# Patient Record
Sex: Female | Born: 1960 | Race: White | Hispanic: No | Marital: Married | Smoking: Current every day smoker
Health system: Southern US, Community
[De-identification: ages and names within clinical notes are randomized; demographics above are authoritative.]

## PROBLEM LIST (undated history)

## (undated) DIAGNOSIS — G8929 Other chronic pain: Secondary | ICD-10-CM

## (undated) DIAGNOSIS — J449 Chronic obstructive pulmonary disease, unspecified: Secondary | ICD-10-CM

## (undated) DIAGNOSIS — R188 Other ascites: Secondary | ICD-10-CM

## (undated) DIAGNOSIS — K7460 Unspecified cirrhosis of liver: Secondary | ICD-10-CM

## (undated) DIAGNOSIS — R7303 Prediabetes: Secondary | ICD-10-CM

## (undated) DIAGNOSIS — I10 Essential (primary) hypertension: Secondary | ICD-10-CM

## (undated) DIAGNOSIS — M549 Dorsalgia, unspecified: Secondary | ICD-10-CM

## (undated) DIAGNOSIS — F32A Depression, unspecified: Secondary | ICD-10-CM

## (undated) DIAGNOSIS — F329 Major depressive disorder, single episode, unspecified: Secondary | ICD-10-CM

## (undated) DIAGNOSIS — J45909 Unspecified asthma, uncomplicated: Secondary | ICD-10-CM

## (undated) DIAGNOSIS — K219 Gastro-esophageal reflux disease without esophagitis: Secondary | ICD-10-CM

## (undated) DIAGNOSIS — E669 Obesity, unspecified: Secondary | ICD-10-CM

## (undated) HISTORY — PX: ROTATOR CUFF REPAIR: SHX139

## (undated) HISTORY — PX: OTHER SURGICAL HISTORY: SHX169

---

## 1984-02-15 HISTORY — PX: KNEE ARTHROSCOPY W/ INTERNAL FIXATION TIBIAL SPINE FRACTURE: SHX1871

## 1993-02-14 HISTORY — PX: LUMBAR DISC SURGERY: SHX700

## 1997-08-05 ENCOUNTER — Encounter: Admission: RE | Admit: 1997-08-05 | Discharge: 1997-11-03 | Payer: Self-pay | Admitting: Anesthesiology

## 1997-09-03 ENCOUNTER — Encounter: Admission: RE | Admit: 1997-09-03 | Discharge: 1997-12-02 | Payer: Self-pay | Admitting: Anesthesiology

## 1998-02-14 HISTORY — PX: LUMBAR FUSION: SHX111

## 1998-04-22 ENCOUNTER — Ambulatory Visit (HOSPITAL_COMMUNITY): Admission: RE | Admit: 1998-04-22 | Discharge: 1998-04-22 | Payer: Self-pay | Admitting: Neurosurgery

## 1998-04-22 ENCOUNTER — Encounter: Payer: Self-pay | Admitting: Neurosurgery

## 1998-05-18 ENCOUNTER — Ambulatory Visit: Admission: RE | Admit: 1998-05-18 | Discharge: 1998-05-18 | Payer: Self-pay | Admitting: Neurosurgery

## 1998-05-18 ENCOUNTER — Encounter: Payer: Self-pay | Admitting: Neurosurgery

## 1998-12-07 ENCOUNTER — Encounter: Payer: Self-pay | Admitting: Neurosurgery

## 1998-12-10 ENCOUNTER — Inpatient Hospital Stay (HOSPITAL_COMMUNITY): Admission: RE | Admit: 1998-12-10 | Discharge: 1998-12-12 | Payer: Self-pay | Admitting: Neurosurgery

## 1998-12-10 ENCOUNTER — Encounter: Payer: Self-pay | Admitting: Neurosurgery

## 1999-01-11 ENCOUNTER — Ambulatory Visit (HOSPITAL_COMMUNITY): Admission: RE | Admit: 1999-01-11 | Discharge: 1999-01-11 | Payer: Self-pay | Admitting: Neurosurgery

## 1999-01-11 ENCOUNTER — Encounter: Payer: Self-pay | Admitting: Neurosurgery

## 1999-03-22 ENCOUNTER — Encounter: Payer: Self-pay | Admitting: Neurosurgery

## 1999-03-22 ENCOUNTER — Ambulatory Visit (HOSPITAL_COMMUNITY): Admission: RE | Admit: 1999-03-22 | Discharge: 1999-03-22 | Payer: Self-pay | Admitting: Neurosurgery

## 1999-09-27 ENCOUNTER — Encounter: Payer: Self-pay | Admitting: Physical Medicine and Rehabilitation

## 1999-09-27 ENCOUNTER — Ambulatory Visit (HOSPITAL_COMMUNITY)
Admission: RE | Admit: 1999-09-27 | Discharge: 1999-09-27 | Payer: Self-pay | Admitting: Physical Medicine and Rehabilitation

## 1999-12-29 ENCOUNTER — Ambulatory Visit (HOSPITAL_COMMUNITY): Admission: RE | Admit: 1999-12-29 | Discharge: 1999-12-29 | Payer: Self-pay | Admitting: Neurosurgery

## 1999-12-29 ENCOUNTER — Encounter: Payer: Self-pay | Admitting: Neurosurgery

## 2000-02-13 ENCOUNTER — Emergency Department (HOSPITAL_COMMUNITY): Admission: EM | Admit: 2000-02-13 | Discharge: 2000-02-13 | Payer: Self-pay | Admitting: Emergency Medicine

## 2000-02-22 ENCOUNTER — Encounter: Payer: Self-pay | Admitting: Neurosurgery

## 2000-02-22 ENCOUNTER — Ambulatory Visit (HOSPITAL_COMMUNITY): Admission: RE | Admit: 2000-02-22 | Discharge: 2000-02-22 | Payer: Self-pay | Admitting: Neurosurgery

## 2000-05-02 ENCOUNTER — Emergency Department (HOSPITAL_COMMUNITY): Admission: EM | Admit: 2000-05-02 | Discharge: 2000-05-03 | Payer: Self-pay | Admitting: Emergency Medicine

## 2000-12-12 ENCOUNTER — Emergency Department (HOSPITAL_COMMUNITY): Admission: EM | Admit: 2000-12-12 | Discharge: 2000-12-12 | Payer: Self-pay | Admitting: *Deleted

## 2002-06-27 ENCOUNTER — Encounter
Admission: RE | Admit: 2002-06-27 | Discharge: 2002-09-25 | Payer: Self-pay | Admitting: Physical Medicine & Rehabilitation

## 2013-02-14 HISTORY — PX: COLONOSCOPY: SHX174

## 2013-07-29 ENCOUNTER — Other Ambulatory Visit (HOSPITAL_BASED_OUTPATIENT_CLINIC_OR_DEPARTMENT_OTHER): Payer: Self-pay | Admitting: Family Medicine

## 2013-07-29 DIAGNOSIS — R109 Unspecified abdominal pain: Secondary | ICD-10-CM

## 2013-07-30 ENCOUNTER — Ambulatory Visit (HOSPITAL_BASED_OUTPATIENT_CLINIC_OR_DEPARTMENT_OTHER): Payer: Medicare PPO

## 2013-08-02 ENCOUNTER — Ambulatory Visit (INDEPENDENT_AMBULATORY_CARE_PROVIDER_SITE_OTHER): Payer: Medicare PPO

## 2013-08-02 DIAGNOSIS — R109 Unspecified abdominal pain: Secondary | ICD-10-CM

## 2013-08-02 DIAGNOSIS — R9389 Abnormal findings on diagnostic imaging of other specified body structures: Secondary | ICD-10-CM

## 2013-08-06 ENCOUNTER — Other Ambulatory Visit (HOSPITAL_COMMUNITY): Payer: Self-pay | Admitting: Gastroenterology

## 2013-08-06 DIAGNOSIS — R188 Other ascites: Secondary | ICD-10-CM

## 2013-08-08 ENCOUNTER — Encounter (HOSPITAL_COMMUNITY)
Admission: RE | Admit: 2013-08-08 | Discharge: 2013-08-08 | Disposition: A | Discharge status: Home / Self Care | Payer: Medicare PPO | Source: Ambulatory Visit | Attending: Gastroenterology | Admitting: Gastroenterology

## 2013-08-08 ENCOUNTER — Encounter (HOSPITAL_COMMUNITY): Payer: Self-pay

## 2013-08-08 ENCOUNTER — Ambulatory Visit (HOSPITAL_COMMUNITY): Payer: Medicare PPO

## 2013-08-08 ENCOUNTER — Ambulatory Visit (HOSPITAL_COMMUNITY)
Admission: RE | Admit: 2013-08-08 | Discharge: 2013-08-08 | Disposition: A | Discharge status: Home / Self Care | Payer: Medicare PPO | Source: Ambulatory Visit | Attending: Gastroenterology | Admitting: Gastroenterology

## 2013-08-08 DIAGNOSIS — R188 Other ascites: Secondary | ICD-10-CM | POA: Insufficient documentation

## 2013-08-08 HISTORY — DX: Chronic obstructive pulmonary disease, unspecified: J44.9

## 2013-08-08 HISTORY — DX: Major depressive disorder, single episode, unspecified: F32.9

## 2013-08-08 HISTORY — DX: Obesity, unspecified: E66.9

## 2013-08-08 HISTORY — DX: Gastro-esophageal reflux disease without esophagitis: K21.9

## 2013-08-08 HISTORY — DX: Other chronic pain: G89.29

## 2013-08-08 HISTORY — DX: Other ascites: R18.8

## 2013-08-08 HISTORY — DX: Depression, unspecified: F32.A

## 2013-08-08 HISTORY — DX: Dorsalgia, unspecified: M54.9

## 2013-08-08 HISTORY — DX: Essential (primary) hypertension: I10

## 2013-08-08 HISTORY — DX: Prediabetes: R73.03

## 2013-08-08 HISTORY — DX: Unspecified asthma, uncomplicated: J45.909

## 2013-08-08 LAB — BODY FLUID CELL COUNT WITH DIFFERENTIAL
Lymphs, Fluid: 18 %
Monocyte-Macrophage-Serous Fluid: 81 % (ref 50–90)
Neutrophil Count, Fluid: 1 % (ref 0–25)
Total Nucleated Cell Count, Fluid: 117 cu mm (ref 0–1000)

## 2013-08-08 LAB — ALBUMIN, FLUID (OTHER): Albumin, Fluid: 0.3 g/dL

## 2013-08-08 LAB — PROTEIN, BODY FLUID: Total protein, fluid: 0.6 g/dL

## 2013-08-08 LAB — LACTATE DEHYDROGENASE, PLEURAL OR PERITONEAL FLUID: LD FL: 32 U/L — AB (ref 3–23)

## 2013-08-08 MED ORDER — SODIUM CHLORIDE 0.9 % IV SOLN
Freq: Once | INTRAVENOUS | Status: AC
  Administered 2013-08-08: 250 mL via INTRAVENOUS

## 2013-08-08 MED ORDER — ALBUMIN HUMAN 25 % IV SOLN
50.0000 g | Freq: Once | INTRAVENOUS | Status: AC
  Administered 2013-08-08: 50 g via INTRAVENOUS
  Filled 2013-08-08: qty 200

## 2013-08-08 NOTE — Progress Notes (Signed)
Pt here in Short Stay to receive 50 GM Albumin prior to her paracentesis. Pt tolerated this well and is now going to [facility] for her paracentesis via w/c

## 2013-08-08 NOTE — Procedures (Signed)
Successful US guided paracentesis from RLQ.  Yielded 6.5L of clear yellow fluid.  No immediate complications.  Pt tolerated well.   Specimen was sent for labs. The pt received 50g IV Albumin prior to the procedure as ordered  Brayton ElBRUNING, KEVIN PA-C 08/08/2013 1:27 PM

## 2013-08-08 NOTE — Discharge Instructions (Signed)
Albumin injection What is this medicine? ALBUMIN (al BYOO min) is used to treat or prevent shock following serious injury, bleeding, surgery, or burns by increasing the volume of blood plasma. This medicine can also replace low blood protein. This medicine may be used for other purposes; ask your health care provider or pharmacist if you have questions. COMMON BRAND NAME(S): Albuked, Albumarc, Albuminar, Albutein, Buminate, Flexbumin, Kedbumin, Macrotec, Plasbumin What should I tell my health care provider before I take this medicine? They need to know if you have any of the following conditions: -anemia -heart disease -kidney disease -an unusual or allergic reaction to albumin, other medicines, foods, dyes, or preservatives -pregnant or trying to get pregnant -breast-feeding How should I use this medicine? This medicine is for infusion into a vein. It is given by a health-care professional in a hospital or clinic. Talk to your pediatrician regarding the use of this medicine in children. While this drug may be prescribed for selected conditions, precautions do apply. Overdosage: If you think you have taken too much of this medicine contact a poison control center or emergency room at once. NOTE: This medicine is only for you. Do not share this medicine with others. What if I miss a dose? This does not apply. What may interact with this medicine? Interactions are not expected. This list may not describe all possible interactions. Give your health care provider a list of all the medicines, herbs, non-prescription drugs, or dietary supplements you use. Also tell them if you smoke, drink alcohol, or use illegal drugs. Some items may interact with your medicine. What should I watch for while using this medicine? Your condition will be closely monitored while you receive this medicine. Some products are derived from human plasma, and there is a small risk that these products may contain certain  types of virus or bacteria. All products are processed to kill most viruses and bacteria. If you have questions concerning the risk of infections, discuss them with your doctor or health care professional. What side effects may I notice from receiving this medicine? Side effects that you should report to your doctor or health care professional as soon as possible: -allergic reactions like skin rash, itching or hives, swelling of the face, lips, or tongue -breathing problems -changes in heartbeat -fever, chills -pain, redness or swelling at the injection site -signs of viral infection including fever, drowsiness, chills, runny nose followed in about 2 weeks by a rash and joint pain -tightness in the chest Side effects that usually do not require medical attention (report to your doctor or health care professional if they continue or are bothersome): -increased salivation -nausea, vomiting This list may not describe all possible side effects. Call your doctor for medical advice about side effects. You may report side effects to FDA at 1-800-FDA-1088. Where should I keep my medicine? This does not apply. You will not be given this medicine to store at home. NOTE: This sheet is a summary. It may not cover all possible information. If you have questions about this medicine, talk to your doctor, pharmacist, or health care provider.  2015, Elsevier/Gold Standard. (2007-04-26 10:18:55) Ascites Ascites is a gathering of fluid in the belly (abdomen). This is most often caused by liver disease. It may also be caused by a number of other less common problems. It causes a ballooning out (distension) of the abdomen. CAUSES  Scarring of the liver (cirrhosis) is the most common cause of ascites. Other causes include:  Infection or inflammation in  the abdomen.  Cancer in the abdomen.  Heart failure.  Certain forms of kidney failure (nephritic syndrome).  Inflammation of the pancreas.  Clots in the  veins of the liver. SYMPTOMS  In the early stages of ascites, you may not have any symptoms. The main symptom of ascites is a sense of abdominal bloating. This is due to the presence of fluid. This may also cause an increase in abdominal or waist size. People with this condition can develop swelling in the legs, and men can develop a swollen scrotum. When there is a lot of fluid, it may be hard to breath. Stretching of the abdomen by fluid can be painful. DIAGNOSIS  Certain features of your medical history, such as a history of liver disease and of an enlarging abdomen, can suggest the presence of ascites. The diagnosis of ascites can be made on physical exam by your caregiver. An abdominal ultrasound examination can confirm that ascites is present, and estimate the amount of fluid. Once ascites is confirmed, it is important to determine its cause. Again, a history of one of the conditions listed in "CAUSES" provides a strong clue. A physical exam is important, and blood and X-ray tests may be needed. During a procedure called paracentesis, a sample of fluid is removed from the abdomen. This can determine certain key features about the fluid, such as whether or not infection or cancer is present. Your caregiver will determine if a paracentesis is necessary. They will describe the procedure to you. PREVENTION  Ascites is a complication of other conditions. Therefore to prevent ascites, you must seek treatment for any significant health conditions you have. Once ascites is present, careful attention to fluid and salt intake may help prevent it from getting worse. If you have ascites, you should not drink alcohol. PROGNOSIS  The prognosis of ascites depends on the underlying disease. If the disease is reversible, such as with certain infections or with heart failure, then ascites may improve or disappear. When ascites is caused by cirrhosis, then it indicates that the liver disease has worsened, and further  evaluation and treatment of the liver disease is needed. If your ascites is caused by cancer, then the success or failure of the cancer treatment will determine whether your ascites will improve or worsen. RISKS AND COMPLICATIONS  Ascites is likely to worsen if it is not properly diagnosed and treated. A large amount of ascites can cause pain and difficulty breathing. The main complication, besides worsening, is infection (called spontaneous bacterial peritonitis). This requires prompt treatment. TREATMENT  The treatment of ascites depends on its cause. When liver disease is your cause, medical management using water pills (diuretics) and decreasing salt intake is often effective. Ascites due to peritoneal inflammation or malignancy (cancer) alone does not respond to salt restriction and diuretics. Hospitalization is sometimes required. If the treatment of ascites cannot be managed with medications, a number of other treatments are available. Your caregivers will help you decide which will work best for you. Some of these are:  Removal of fluid from the abdomen (paracentesis).  Fluid from the abdomen is passed into a vein (peritoneovenous shunting).  Liver transplantation.  Transjugular intrahepatic portosystemic stent shunt. HOME CARE INSTRUCTIONS  It is important to monitor body weight and the intake and output of fluids. Weigh yourself at the same time every day. Record your weights. Fluid restriction may be necessary. It is also important to know your salt intake. The more salt you take in, the more fluid you  will retain. Ninety percent of people with ascites respond to this approach.  Follow any directions for medicines carefully.  Follow up with your caregiver, as directed.  Report any changes in your health, especially any new or worsening symptoms.  If your ascites is from liver disease, avoid alcohol and other substances toxic to the liver. SEEK MEDICAL CARE IF:   Your weight  increases more than a few pounds in a few days.  Your abdominal or waist size increases.  You develop swelling in your legs.  You had swelling and it worsens. SEEK IMMEDIATE MEDICAL CARE IF:   You develop a fever.  You develop new abdominal pain.  You develop difficulty breathing.  You develop confusion.  You have bleeding from the mouth, stomach, or rectum. MAKE SURE YOU:   Understand these instructions.  Will watch your condition.  Will get help right away if you are not doing well or get worse. Document Released: 01/31/2005 Document Revised: 04/25/2011 Document Reviewed: 09/01/2006 [facility] Patient Information 2015 [place], [place]. This information is not intended to replace advice given to you by your health care provider. Make sure you discuss any questions you have with your health care provider. Paracentesis Paracentesis Paracentesis is a procedure used to remove excess fluid from the belly (abdomen). Excess fluid in the belly is called ascites. Excess fluid can be the result of certain conditions, such as infection, inflammation, abdominal injury, heart failure, chronic scarring of the liver (cirrhosis), or cancer. The excess fluid is removed using a needle inserted through the skin and tissue into the abdomen.  A paracentesis may be done to:  Determine the cause of the excess fluid through examination of the fluid.  Relieve symptoms of shortness of breath or pain caused by the excess fluid.  Determine presence of bleeding after an abdominal injury. LET YOUR CAREGIVERS KNOW ABOUT:  Allergies.  Medications taken including herbs, eye drops, over-the-counter medications, and creams.  Use of steroids (by mouth or creams).  Previous problems with anesthetics or numbing medicine.  Possibility of pregnancy, if this applies.  History of blood clots (thrombophlebitis).  History of bleeding or blood problems.  Previous surgery.  Other health problems. RISKS AND  COMPLICATIONS  Injury to an abdominal organ, such as the bowel (large intestine), liver, spleen, or bladder.  Possible infection.  Bleeding.  Low blood pressure (hypotension). BEFORE THE PROCEDURE This is a procedure that can be done as an outpatient. Confirm the time that you need to arrive for your procedure. A blood sample may be done to determine your blood clotting time. The presence of a severe bleeding disorder (coagulopathy) which cannot be promptly corrected may make this procedure inadvisable. You may be asked to urinate. PROCEDURE The procedure will take about 30 minutes. This time will vary depending on the amount of fluid that is removed. You may be asked to lie on your back with your head elevated. An area on your abdomen will be cleansed. A numbing medicine may then be injected (local anesthesia) into the skin and tissue. A needle is inserted through your abdominal skin and tissues until it is positioned in your abdomen. You may feel pressure or slight pain as the needle is positioned into the abdomen. Fluid is removed from the abdomen through the needle. Tell your caregiver if you feel dizzy or lightheaded. The needle is withdrawn once the desired amount of fluid has been removed. A sample of the fluid may be sent for examination.  AFTER THE PROCEDURE Your recovery  will be assessed and monitored. If there are no problems, as an outpatient, you should be able to go home shortly after the procedure. There may be a very limited amount of clear fluid draining from the needle insertion site over the next 2 days. Confirm with your caregiver as to the expected amount of drainage. Obtaining the Test Results It is your responsibility to obtain your test results. Do not assume everything is normal if you have not heard from your caregiver or the medical facility. It is important for you to follow up on all of your test results. HOME CARE INSTRUCTIONS   You may resume normal diet and  activities as directed or allowed.  Only take over-the-counter or prescription medicines for pain, discomfort, or fever as directed by your caregiver. SEEK IMMEDIATE MEDICAL CARE IF:  You develop shortness of breath or chest pain.  You develop increasing pain, discomfort, or swelling in your abdomen.  You develop new drainage or pus coming from site where fluid was removed.  You develop swelling or increased redness from site where fluid was removed.  You develop an unexplained temperature of 102 F (38.9 C) or above. Document Released: 08/16/2004 Document Revised: 04/25/2011 Document Reviewed: 09/22/2008 [facility] Patient Information 2015 [place], [place]. This information is not intended to replace advice given to you by your health care provider. Make sure you discuss any questions you have with your health care provider.

## 2013-08-11 LAB — BODY FLUID CULTURE
Culture: NO GROWTH
Gram Stain: NONE SEEN

## 2013-08-23 ENCOUNTER — Other Ambulatory Visit (HOSPITAL_COMMUNITY): Payer: Self-pay | Admitting: Gastroenterology

## 2013-08-23 DIAGNOSIS — R188 Other ascites: Secondary | ICD-10-CM

## 2013-08-27 ENCOUNTER — Ambulatory Visit (HOSPITAL_COMMUNITY)
Admission: RE | Admit: 2013-08-27 | Discharge: 2013-08-27 | Disposition: A | Discharge status: Home / Self Care | Payer: Medicare PPO | Source: Ambulatory Visit | Attending: Gastroenterology | Admitting: Gastroenterology

## 2013-08-27 DIAGNOSIS — R188 Other ascites: Secondary | ICD-10-CM

## 2013-08-27 LAB — BODY FLUID CELL COUNT WITH DIFFERENTIAL
Eos, Fluid: 0 %
LYMPHS FL: 13 %
MONOCYTE-MACROPHAGE-SEROUS FLUID: 84 % (ref 50–90)
NEUTROPHIL FLUID: 3 % (ref 0–25)
Other Cells, Fluid: 0 %
Total Nucleated Cell Count, Fluid: 91 cu mm (ref 0–1000)

## 2013-08-27 LAB — PROTEIN, BODY FLUID: TOTAL PROTEIN, FLUID: 0.7 g/dL

## 2013-08-27 LAB — ALBUMIN, FLUID (OTHER): Albumin, Fluid: 0.3 g/dL

## 2013-08-27 LAB — LACTATE DEHYDROGENASE, PLEURAL OR PERITONEAL FLUID: LD, Fluid: 37 U/L — ABNORMAL HIGH (ref 3–23)

## 2013-08-27 MED ORDER — ALBUMIN HUMAN 25 % IV SOLN
50.0000 g | Freq: Once | INTRAVENOUS | Status: AC
Start: 2013-08-27 — End: 2013-08-27
  Administered 2013-08-27: 50 g via INTRAVENOUS
  Filled 2013-08-27 (×2): qty 200

## 2013-08-27 NOTE — Procedures (Signed)
Successful US guided paracentesis from RUQ.  Yielded 3.9 liters of clear yellow fluid.  No immediate complications.  Pt tolerated well.   Specimen was sent for labs.  Pattricia BossMORGAN, Trashaun Streight D PA-C 08/27/2013 2:21 PM

## 2013-08-31 LAB — BODY FLUID CULTURE
Culture: NO GROWTH
GRAM STAIN: NONE SEEN

## 2013-09-11 ENCOUNTER — Encounter (HOSPITAL_COMMUNITY): Payer: Self-pay | Admitting: *Deleted

## 2013-09-12 ENCOUNTER — Encounter (HOSPITAL_COMMUNITY): Payer: Self-pay | Admitting: Pharmacy Technician

## 2013-09-24 ENCOUNTER — Other Ambulatory Visit: Payer: Self-pay | Admitting: Gastroenterology

## 2013-09-24 NOTE — Addendum Note (Signed)
Addended by: Caily Rakers on: 09/24/2013 04:25 PM   Modules accepted: Orders  

## 2013-09-25 ENCOUNTER — Encounter (HOSPITAL_COMMUNITY): Payer: Medicare HMO | Admitting: Anesthesiology

## 2013-09-25 ENCOUNTER — Encounter (HOSPITAL_COMMUNITY)
Admission: RE | Disposition: A | Discharge status: Home / Self Care | Payer: Self-pay | Source: Ambulatory Visit | Attending: Gastroenterology

## 2013-09-25 ENCOUNTER — Encounter (HOSPITAL_COMMUNITY): Payer: Self-pay | Admitting: *Deleted

## 2013-09-25 ENCOUNTER — Ambulatory Visit (HOSPITAL_COMMUNITY): Payer: Medicare HMO | Admitting: Anesthesiology

## 2013-09-25 ENCOUNTER — Ambulatory Visit (HOSPITAL_COMMUNITY)
Admission: RE | Admit: 2013-09-25 | Discharge: 2013-09-25 | Disposition: A | Discharge status: Home / Self Care | Payer: Medicare HMO | Source: Ambulatory Visit | Attending: Gastroenterology | Admitting: Gastroenterology

## 2013-09-25 DIAGNOSIS — K7030 Alcoholic cirrhosis of liver without ascites: Secondary | ICD-10-CM | POA: Insufficient documentation

## 2013-09-25 DIAGNOSIS — F1020 Alcohol dependence, uncomplicated: Secondary | ICD-10-CM | POA: Diagnosis not present

## 2013-09-25 DIAGNOSIS — I8510 Secondary esophageal varices without bleeding: Secondary | ICD-10-CM | POA: Insufficient documentation

## 2013-09-25 DIAGNOSIS — R188 Other ascites: Secondary | ICD-10-CM | POA: Insufficient documentation

## 2013-09-25 DIAGNOSIS — K766 Portal hypertension: Secondary | ICD-10-CM | POA: Insufficient documentation

## 2013-09-25 DIAGNOSIS — R7989 Other specified abnormal findings of blood chemistry: Secondary | ICD-10-CM | POA: Diagnosis not present

## 2013-09-25 DIAGNOSIS — K838 Other specified diseases of biliary tract: Secondary | ICD-10-CM | POA: Insufficient documentation

## 2013-09-25 HISTORY — PX: ESOPHAGOGASTRODUODENOSCOPY (EGD) WITH PROPOFOL: SHX5813

## 2013-09-25 HISTORY — PX: EUS: SHX5427

## 2013-09-25 SURGERY — ESOPHAGOGASTRODUODENOSCOPY (EGD) WITH PROPOFOL
Anesthesia: Monitor Anesthesia Care

## 2013-09-25 MED ORDER — FENTANYL CITRATE 0.05 MG/ML IJ SOLN
INTRAMUSCULAR | Status: AC
  Filled 2013-09-25: qty 2

## 2013-09-25 MED ORDER — PROPOFOL 10 MG/ML IV BOLUS
INTRAVENOUS | Status: AC
Start: 2013-09-25 — End: 2013-09-25
  Filled 2013-09-25: qty 20

## 2013-09-25 MED ORDER — PROPOFOL 10 MG/ML IV BOLUS
INTRAVENOUS | Status: DC | PRN
  Administered 2013-09-25: 10 mg via INTRAVENOUS
  Administered 2013-09-25: 20 mg via INTRAVENOUS

## 2013-09-25 MED ORDER — LIDOCAINE HCL (CARDIAC) 20 MG/ML IV SOLN
INTRAVENOUS | Status: AC
  Filled 2013-09-25: qty 5

## 2013-09-25 MED ORDER — PROPOFOL 10 MG/ML IV BOLUS
INTRAVENOUS | Status: AC
  Filled 2013-09-25: qty 20

## 2013-09-25 MED ORDER — FENTANYL CITRATE 0.05 MG/ML IJ SOLN
INTRAMUSCULAR | Status: DC | PRN
  Administered 2013-09-25: 100 ug via INTRAVENOUS

## 2013-09-25 MED ORDER — MIDAZOLAM HCL 5 MG/5ML IJ SOLN
INTRAMUSCULAR | Status: DC | PRN
  Administered 2013-09-25: 1 mg via INTRAVENOUS
  Administered 2013-09-25 (×2): 0.5 mg via INTRAVENOUS

## 2013-09-25 MED ORDER — PHENYLEPHRINE HCL 10 MG/ML IJ SOLN
INTRAMUSCULAR | Status: DC | PRN
  Administered 2013-09-25: 40 ug via INTRAVENOUS

## 2013-09-25 MED ORDER — GLYCOPYRROLATE 0.2 MG/ML IJ SOLN
INTRAMUSCULAR | Status: AC
  Filled 2013-09-25: qty 1

## 2013-09-25 MED ORDER — LACTATED RINGERS IV SOLN
INTRAVENOUS | Status: DC
  Administered 2013-09-25: 1000 mL via INTRAVENOUS

## 2013-09-25 MED ORDER — PROPOFOL INFUSION 10 MG/ML OPTIME
INTRAVENOUS | Status: DC | PRN
  Administered 2013-09-25: 100 ug/kg/min via INTRAVENOUS

## 2013-09-25 MED ORDER — SODIUM CHLORIDE 0.9 % IV SOLN: INTRAVENOUS | Status: DC

## 2013-09-25 MED ORDER — GLYCOPYRROLATE 0.2 MG/ML IJ SOLN
INTRAMUSCULAR | Status: DC | PRN
  Administered 2013-09-25 (×2): 0.1 mg via INTRAVENOUS

## 2013-09-25 MED ORDER — BUTAMBEN-TETRACAINE-BENZOCAINE 2-2-14 % EX AERO
INHALATION_SPRAY | CUTANEOUS | Status: DC | PRN
  Administered 2013-09-25: 2 via TOPICAL

## 2013-09-25 MED ORDER — FENTANYL CITRATE 0.05 MG/ML IJ SOLN: 25.0000 ug | INTRAMUSCULAR | Status: DC | PRN

## 2013-09-25 MED ORDER — PHENYLEPHRINE 40 MCG/ML (10ML) SYRINGE FOR IV PUSH (FOR BLOOD PRESSURE SUPPORT)
PREFILLED_SYRINGE | INTRAVENOUS | Status: AC
  Filled 2013-09-25: qty 10

## 2013-09-25 MED ORDER — MIDAZOLAM HCL 2 MG/2ML IJ SOLN
INTRAMUSCULAR | Status: AC
  Filled 2013-09-25: qty 2

## 2013-09-25 SURGICAL SUPPLY — 14 items

## 2013-09-25 NOTE — Discharge Instructions (Signed)
Esophagogastroduodenoscopy °Care After °Refer to this sheet in the next few weeks. These instructions provide you with information on caring for yourself after your procedure. Your caregiver may also give you more specific instructions. Your treatment has been planned according to current medical practices, but problems sometimes occur. Call your caregiver if you have any problems or questions after your procedure.  °HOME CARE INSTRUCTIONS °· Do not eat or drink anything until the numbing medicine (local anesthetic) has worn off and your gag reflex has returned. You will know that the local anesthetic has worn off when you can swallow comfortably. °· Do not drive for 12 hours after the procedure or as directed by your caregiver. °· Only take medicines as directed by your caregiver. °SEEK MEDICAL CARE IF:  °· You cannot stop coughing. °· You are not urinating at all or less than usual. °SEEK IMMEDIATE MEDICAL CARE IF: °· You have difficulty swallowing. °· You cannot eat or drink. °· You have worsening throat or chest pain. °· You have dizziness, lightheadedness, or you faint. °· You have nausea or vomiting. °· You have chills. °· You have a fever. °· You have severe abdominal pain. °· You have black, tarry, or bloody stools. °Document Released: 01/18/2012 Document Reviewed: 01/18/2012 °ExitCare® Patient Information ©2015 [employer], LLC. This information is not intended to replace advice given to you by your health care provider. Make sure you discuss any questions you have with your health care provider. ° °

## 2013-09-25 NOTE — H&P (Signed)
Patient interval history reviewed.  Patient examined again.  There has been no change from documented H/P dated 09/10/13 (scanned into chart from our office) except as documented above.  Assessment:  1.  Cirrhosis, alcohol-mediated. 2.  Dilated bile duct. 3.  Elevated LFTs, improving upon alcohol cessation.  Plan:  1.  Endoscopy for variceal screening. 2.  Endoscopic ultrasound to assess dilated bile duct and elevated LFTs. 3.  Risks (bleeding, infection, bowel perforation that could require surgery, sedation-related changes in cardiopulmonary systems), benefits (identification and possible treatment of source of symptoms, exclusion of certain causes of symptoms), and alternatives (watchful waiting, radiographic imaging studies, empiric medical treatment) of upper endoscopy as well as upper endoscopic ultrasound (EGD + EUS) were explained to patient/family in detail and patient wishes to proceed.

## 2013-09-25 NOTE — Anesthesia Preprocedure Evaluation (Addendum)
Anesthesia Evaluation  Patient identified by MRN, date of birth, ID band Patient awake    Reviewed: Allergy & Precautions, H&P , NPO status , Patient's Chart, lab work & pertinent test results, reviewed documented beta blocker date and time   Airway Mallampati: II TM Distance: >3 FB Neck ROM: full    Dental  (+) Caps, Dental Advisory Given Caps lower front:   Pulmonary neg pulmonary ROS, COPD COPD inhaler, Current Smoker,  breath sounds clear to auscultation  Pulmonary exam normal       Cardiovascular hypertension, Pt. on home beta blockers Rhythm:regular Rate:Normal     Neuro/Psych negative neurological ROS  negative psych ROS   GI/Hepatic negative GI ROS, Neg liver ROS, GERD-  Medicated and Controlled,ascites   Endo/Other  negative endocrine ROS  Renal/GU negative Renal ROS  negative genitourinary   Musculoskeletal   Abdominal   Peds  Hematology negative hematology ROS (+)   Anesthesia Other Findings   Reproductive/Obstetrics negative OB ROS                          Anesthesia Physical Anesthesia Plan  ASA: III  Anesthesia Plan: MAC   Post-op Pain Management:    Induction:   Airway Management Planned:   Additional Equipment:   Intra-op Plan:   Post-operative Plan:   Informed Consent: I have reviewed the patients History and Physical, chart, labs and discussed the procedure including the risks, benefits and alternatives for the proposed anesthesia with the patient or authorized representative who has indicated his/her understanding and acceptance.   Dental Advisory Given  Plan Discussed with: CRNA and Surgeon  Anesthesia Plan Comments:         Anesthesia Quick Evaluation

## 2013-09-25 NOTE — Anesthesia Postprocedure Evaluation (Signed)
  Anesthesia Post-op Note  Patient: Melissa Stout  Procedure(s) Performed: Procedure(s) (LRB): ESOPHAGOGASTRODUODENOSCOPY (EGD) WITH PROPOFOL (N/A) ESOPHAGEAL ENDOSCOPIC ULTRASOUND (EUS) RADIAL (N/A)  Patient Location: PACU  Anesthesia Type: MAC  Level of Consciousness: awake and alert   Airway and Oxygen Therapy: Patient Spontanous Breathing  Post-op Pain: mild  Post-op Assessment: Post-op Vital signs reviewed, Patient's Cardiovascular Status Stable, Respiratory Function Stable, Patent Airway and No signs of Nausea or vomiting  Last Vitals:  Filed Vitals:   09/25/13 1137  BP:   Pulse: 68  Temp:   Resp: 19    Post-op Vital Signs: stable   Complications: No apparent anesthesia complications

## 2013-09-25 NOTE — Transfer of Care (Signed)
Immediate Anesthesia Transfer of Care Note  Patient: Melissa Stout  Procedure(s) Performed: Procedure(s): ESOPHAGOGASTRODUODENOSCOPY (EGD) WITH PROPOFOL (N/A) ESOPHAGEAL ENDOSCOPIC ULTRASOUND (EUS) RADIAL (N/A)  Patient Location: PACU  Anesthesia Type:MAC  Level of Consciousness: awake and oriented  Airway & Oxygen Therapy: Patient Spontanous Breathing and Patient connected to nasal cannula oxygen  Post-op Assessment: Report given to PACU RN and Post -op Vital signs reviewed and stable  Post vital signs: Reviewed and stable  Complications: No apparent anesthesia complications

## 2013-09-25 NOTE — Op Note (Signed)
[facility] [address] [place], [id number]   ENDOSCOPIC ULTRASOUND PROCEDURE REPORT  PATIENT: Melissa Stout, Melissa A.  MR#: [id number] BIRTHDATE: [birth year]  GENDER: Female ENDOSCOPIST: Willis ModenaWilliam Eloise Mula, MD REFERRED BY:  Joycelyn RuaStephen Meyers, M.D. PROCEDURE DATE:  09/25/2013 PROCEDURE:   Upper EUS ASA CLASS:      Class III INDICATIONS:   1.  cirrhosis (variceal screening), dilated bile duct, elevated LFTs. MEDICATIONS: MAC sedation, administered by CRNA  DESCRIPTION OF PROCEDURE:   After the risks benefits and alternatives of the procedure were  explained, informed consent was obtained. The patient was then placed in the left, lateral, decubitus postion and IV sedation was administered. Throughout the procedure, the patients blood pressure, pulse and oxygen saturations were monitored continuously.  Under direct visualization, the     endoscope was introduced through the mouth and advanced to the second portion of the duodenum .  Water was used as necessary to provide an acoustic interface.  Upon completion of the imaging, water was removed and the patient was sent to the recovery room in satisfactory condition.   FINDINGS:      EGD:  Small distal esophageal varices, without red wale signs; moderate diffuse portal gastropathy; mucosal friability throughout the upper intestinal tract. No gastric varices seen upon retroflexion into the cardia.  Otherwise normal endoscopy to the second portion of the duodenum. EUS:  Non-dilated bile duct, without bile duct wall thickening, choledocholithiasis or mass.  Normal-appearing ampulla via EUS. Scattered hyperechoic strands/foci and lobularity throughout the pancreas, not inconsistent with clinically quiescent chronic pancreatitis.  No pancreatic mass, cyst, or peripancreatic adenopathy was identified.  Small amount of perihepatic ascites. Hyperechoic liver consistent with fatty pancreas.  Gallbladder wall slightly thickened, likely  reactive from patient's known cirrhosis, otherwise no obvious gallstones or sludge identified.  IMPRESSION:     As above.  I wonder whether patient ever in fact had a dilated bile duct, as there is a significant discordance between my bile duct diameter today ( 4mm) and the previously noted gallbladder diameter (15mm) on transabdominal ultrasound.  RECOMMENDATIONS:     1.  Watch for potential complications of procedure. 2.  Continued strict alcohol abstinence. 3.  Continued medical therapy for cirrhosis. 4.  Follow-up with [facility] in 3 months.    _______________________________ Rosalie DoctoreSignedWillis Modena:  Marthe Dant, MD 09/25/2013 11:17 AM   CC:

## 2013-09-26 ENCOUNTER — Encounter (HOSPITAL_COMMUNITY): Payer: Self-pay | Admitting: Gastroenterology

## 2014-01-15 ENCOUNTER — Other Ambulatory Visit (HOSPITAL_COMMUNITY): Payer: Self-pay | Admitting: Family Medicine

## 2014-01-15 DIAGNOSIS — Z1231 Encounter for screening mammogram for malignant neoplasm of breast: Secondary | ICD-10-CM

## 2014-01-27 ENCOUNTER — Emergency Department (HOSPITAL_COMMUNITY): Payer: Medicare PPO

## 2014-01-27 ENCOUNTER — Encounter (HOSPITAL_COMMUNITY): Payer: Self-pay | Admitting: Emergency Medicine

## 2014-01-27 ENCOUNTER — Inpatient Hospital Stay (HOSPITAL_COMMUNITY)
Admission: EM | Admit: 2014-01-27 | Discharge: 2014-01-30 | DRG: 871 | Disposition: A | Discharge status: Home / Self Care | Payer: Medicare PPO | Attending: Internal Medicine | Admitting: Internal Medicine

## 2014-01-27 DIAGNOSIS — Z888 Allergy status to other drugs, medicaments and biological substances status: Secondary | ICD-10-CM | POA: Diagnosis not present

## 2014-01-27 DIAGNOSIS — J45909 Unspecified asthma, uncomplicated: Secondary | ICD-10-CM | POA: Diagnosis present

## 2014-01-27 DIAGNOSIS — F1410 Cocaine abuse, uncomplicated: Secondary | ICD-10-CM | POA: Diagnosis present

## 2014-01-27 DIAGNOSIS — D689 Coagulation defect, unspecified: Secondary | ICD-10-CM | POA: Diagnosis present

## 2014-01-27 DIAGNOSIS — K7682 Hepatic encephalopathy: Secondary | ICD-10-CM | POA: Diagnosis present

## 2014-01-27 DIAGNOSIS — K7290 Hepatic failure, unspecified without coma: Secondary | ICD-10-CM | POA: Diagnosis present

## 2014-01-27 DIAGNOSIS — Z6828 Body mass index (BMI) 28.0-28.9, adult: Secondary | ICD-10-CM | POA: Diagnosis not present

## 2014-01-27 DIAGNOSIS — N179 Acute kidney failure, unspecified: Secondary | ICD-10-CM | POA: Diagnosis present

## 2014-01-27 DIAGNOSIS — J449 Chronic obstructive pulmonary disease, unspecified: Secondary | ICD-10-CM | POA: Diagnosis present

## 2014-01-27 DIAGNOSIS — M545 Low back pain: Secondary | ICD-10-CM | POA: Diagnosis present

## 2014-01-27 DIAGNOSIS — F1010 Alcohol abuse, uncomplicated: Secondary | ICD-10-CM | POA: Diagnosis present

## 2014-01-27 DIAGNOSIS — K219 Gastro-esophageal reflux disease without esophagitis: Secondary | ICD-10-CM | POA: Diagnosis present

## 2014-01-27 DIAGNOSIS — F329 Major depressive disorder, single episode, unspecified: Secondary | ICD-10-CM | POA: Diagnosis present

## 2014-01-27 DIAGNOSIS — K7031 Alcoholic cirrhosis of liver with ascites: Secondary | ICD-10-CM | POA: Diagnosis present

## 2014-01-27 DIAGNOSIS — E669 Obesity, unspecified: Secondary | ICD-10-CM | POA: Diagnosis present

## 2014-01-27 DIAGNOSIS — R188 Other ascites: Secondary | ICD-10-CM

## 2014-01-27 DIAGNOSIS — IMO0001 Reserved for inherently not codable concepts without codable children: Secondary | ICD-10-CM | POA: Insufficient documentation

## 2014-01-27 DIAGNOSIS — A419 Sepsis, unspecified organism: Principal | ICD-10-CM | POA: Diagnosis present

## 2014-01-27 DIAGNOSIS — B9620 Unspecified Escherichia coli [E. coli] as the cause of diseases classified elsewhere: Secondary | ICD-10-CM | POA: Diagnosis present

## 2014-01-27 DIAGNOSIS — F17210 Nicotine dependence, cigarettes, uncomplicated: Secondary | ICD-10-CM | POA: Diagnosis present

## 2014-01-27 DIAGNOSIS — R404 Transient alteration of awareness: Secondary | ICD-10-CM

## 2014-01-27 DIAGNOSIS — J189 Pneumonia, unspecified organism: Secondary | ICD-10-CM

## 2014-01-27 DIAGNOSIS — N390 Urinary tract infection, site not specified: Secondary | ICD-10-CM | POA: Diagnosis present

## 2014-01-27 DIAGNOSIS — G8929 Other chronic pain: Secondary | ICD-10-CM | POA: Diagnosis present

## 2014-01-27 DIAGNOSIS — R7309 Other abnormal glucose: Secondary | ICD-10-CM | POA: Diagnosis present

## 2014-01-27 DIAGNOSIS — K7200 Acute and subacute hepatic failure without coma: Secondary | ICD-10-CM | POA: Insufficient documentation

## 2014-01-27 DIAGNOSIS — I10 Essential (primary) hypertension: Secondary | ICD-10-CM | POA: Diagnosis present

## 2014-01-27 DIAGNOSIS — J690 Pneumonitis due to inhalation of food and vomit: Secondary | ICD-10-CM | POA: Diagnosis present

## 2014-01-27 DIAGNOSIS — D696 Thrombocytopenia, unspecified: Secondary | ICD-10-CM | POA: Diagnosis present

## 2014-01-27 DIAGNOSIS — K7460 Unspecified cirrhosis of liver: Secondary | ICD-10-CM | POA: Diagnosis present

## 2014-01-27 LAB — PROTIME-INR
INR: 1.7 — ABNORMAL HIGH (ref 0.00–1.49)
Prothrombin Time: 20.1 seconds — ABNORMAL HIGH (ref 11.6–15.2)

## 2014-01-27 LAB — RAPID URINE DRUG SCREEN, HOSP PERFORMED
Amphetamines: NOT DETECTED
BENZODIAZEPINES: NOT DETECTED
Barbiturates: NOT DETECTED
COCAINE: POSITIVE — AB
Opiates: NOT DETECTED
TETRAHYDROCANNABINOL: NOT DETECTED

## 2014-01-27 LAB — I-STAT TROPONIN, ED: Troponin i, poc: 0.01 ng/mL (ref 0.00–0.08)

## 2014-01-27 LAB — CBC WITH DIFFERENTIAL/PLATELET
BASOS PCT: 0 % (ref 0–1)
Basophils Absolute: 0 10*3/uL (ref 0.0–0.1)
Eosinophils Absolute: 0 10*3/uL (ref 0.0–0.7)
Eosinophils Relative: 0 % (ref 0–5)
HEMATOCRIT: 36 % (ref 36.0–46.0)
Hemoglobin: 12 g/dL (ref 12.0–15.0)
LYMPHS ABS: 0.6 10*3/uL — AB (ref 0.7–4.0)
Lymphocytes Relative: 10 % — ABNORMAL LOW (ref 12–46)
MCH: 35.6 pg — ABNORMAL HIGH (ref 26.0–34.0)
MCHC: 33.3 g/dL (ref 30.0–36.0)
MCV: 106.8 fL — ABNORMAL HIGH (ref 78.0–100.0)
MONO ABS: 0.8 10*3/uL (ref 0.1–1.0)
Monocytes Relative: 13 % — ABNORMAL HIGH (ref 3–12)
NEUTROS ABS: 5.1 10*3/uL (ref 1.7–7.7)
Neutrophils Relative %: 77 % (ref 43–77)
Platelets: 52 10*3/uL — ABNORMAL LOW (ref 150–400)
RBC: 3.37 MIL/uL — AB (ref 3.87–5.11)
RDW: 14.3 % (ref 11.5–15.5)
WBC: 6.6 10*3/uL (ref 4.0–10.5)

## 2014-01-27 LAB — COMPREHENSIVE METABOLIC PANEL
ALBUMIN: 2.9 g/dL — AB (ref 3.5–5.2)
ALK PHOS: 68 U/L (ref 39–117)
ALT: 14 U/L (ref 0–35)
AST: 47 U/L — AB (ref 0–37)
Anion gap: 17 — ABNORMAL HIGH (ref 5–15)
BILIRUBIN TOTAL: 6.6 mg/dL — AB (ref 0.3–1.2)
BUN: 25 mg/dL — ABNORMAL HIGH (ref 6–23)
CHLORIDE: 104 meq/L (ref 96–112)
CO2: 19 meq/L (ref 19–32)
CREATININE: 1.14 mg/dL — AB (ref 0.50–1.10)
Calcium: 8.9 mg/dL (ref 8.4–10.5)
GFR calc Af Amer: 62 mL/min — ABNORMAL LOW (ref >90)
GFR, EST NON AFRICAN AMERICAN: 54 mL/min — AB (ref >90)
Glucose, Bld: 172 mg/dL — ABNORMAL HIGH (ref 70–99)
Potassium: 4 mEq/L (ref 3.7–5.3)
SODIUM: 140 meq/L (ref 137–147)
Total Protein: 7.6 g/dL (ref 6.0–8.3)

## 2014-01-27 LAB — I-STAT CHEM 8, ED
BUN: 26 mg/dL — AB (ref 6–23)
CALCIUM ION: 1.09 mmol/L — AB (ref 1.12–1.23)
CHLORIDE: 107 meq/L (ref 96–112)
CREATININE: 1.3 mg/dL — AB (ref 0.50–1.10)
Glucose, Bld: 178 mg/dL — ABNORMAL HIGH (ref 70–99)
HCT: 39 % (ref 36.0–46.0)
Hemoglobin: 13.3 g/dL (ref 12.0–15.0)
Potassium: 3.8 mEq/L (ref 3.7–5.3)
Sodium: 141 mEq/L (ref 137–147)
TCO2: 19 mmol/L (ref 0–100)

## 2014-01-27 LAB — I-STAT ARTERIAL BLOOD GAS, ED
Acid-base deficit: 5 mmol/L — ABNORMAL HIGH (ref 0.0–2.0)
Bicarbonate: 18.6 mEq/L — ABNORMAL LOW (ref 20.0–24.0)
O2 Saturation: 96 %
TCO2: 19 mmol/L (ref 0–100)
pCO2 arterial: 28.8 mmHg — ABNORMAL LOW (ref 35.0–45.0)
pH, Arterial: 7.418 (ref 7.350–7.450)
pO2, Arterial: 79 mmHg — ABNORMAL LOW (ref 80.0–100.0)

## 2014-01-27 LAB — APTT: aPTT: 33 seconds (ref 24–37)

## 2014-01-27 LAB — I-STAT CG4 LACTIC ACID, ED: Lactic Acid, Venous: 4.04 mmol/L — ABNORMAL HIGH (ref 0.5–2.2)

## 2014-01-27 LAB — URINE MICROSCOPIC-ADD ON

## 2014-01-27 LAB — URINALYSIS, ROUTINE W REFLEX MICROSCOPIC
Glucose, UA: NEGATIVE mg/dL
Ketones, ur: 15 mg/dL — AB
Nitrite: POSITIVE — AB
Protein, ur: NEGATIVE mg/dL
SPECIFIC GRAVITY, URINE: 1.018 (ref 1.005–1.030)
Urobilinogen, UA: 1 mg/dL (ref 0.0–1.0)
pH: 5.5 (ref 5.0–8.0)

## 2014-01-27 LAB — ETHANOL: Alcohol, Ethyl (B): <11 mg/dL (ref 0–11)

## 2014-01-27 LAB — AMMONIA: Ammonia: 116 umol/L — ABNORMAL HIGH (ref 11–60)

## 2014-01-27 LAB — MRSA PCR SCREENING: MRSA by PCR: NEGATIVE

## 2014-01-27 MED ORDER — PIPERACILLIN-TAZOBACTAM 3.375 G IVPB
3.3750 g | Freq: Three times a day (TID) | INTRAVENOUS | Status: DC
  Administered 2014-01-27 – 2014-01-30 (×8): 3.375 g via INTRAVENOUS
  Filled 2014-01-27 (×10): qty 50

## 2014-01-27 MED ORDER — SODIUM CHLORIDE 0.9 % IV SOLN: 250.0000 mL | INTRAVENOUS | Status: DC | PRN

## 2014-01-27 MED ORDER — HALOPERIDOL LACTATE 5 MG/ML IJ SOLN: 1.0000 mg | Freq: Four times a day (QID) | INTRAMUSCULAR | Status: DC | PRN

## 2014-01-27 MED ORDER — ONDANSETRON HCL 4 MG PO TABS: 4.0000 mg | ORAL_TABLET | Freq: Four times a day (QID) | ORAL | Status: DC | PRN

## 2014-01-27 MED ORDER — SODIUM CHLORIDE 0.9 % IJ SOLN
3.0000 mL | Freq: Two times a day (BID) | INTRAMUSCULAR | Status: DC
  Administered 2014-01-29 (×2): 3 mL via INTRAVENOUS

## 2014-01-27 MED ORDER — FUROSEMIDE 80 MG PO TABS
80.0000 mg | ORAL_TABLET | Freq: Two times a day (BID) | ORAL | Status: DC
  Administered 2014-01-28: 80 mg via ORAL
  Filled 2014-01-27 (×2): qty 1

## 2014-01-27 MED ORDER — LACTULOSE ENEMA
300.0000 mL | Freq: Three times a day (TID) | RECTAL | Status: DC
Start: 2014-01-27 — End: 2014-01-28
  Administered 2014-01-27 – 2014-01-28 (×2): 300 mL via RECTAL
  Filled 2014-01-27 (×5): qty 300

## 2014-01-27 MED ORDER — SODIUM CHLORIDE 0.9 % IV SOLN
Freq: Once | INTRAVENOUS | Status: AC
  Administered 2014-01-27: 12:00:00 via INTRAVENOUS

## 2014-01-27 MED ORDER — LORAZEPAM 2 MG/ML IJ SOLN
1.0000 mg | Freq: Once | INTRAMUSCULAR | Status: AC
  Administered 2014-01-27: 1 mg via INTRAVENOUS
  Filled 2014-01-27: qty 1

## 2014-01-27 MED ORDER — ONDANSETRON HCL 4 MG/2ML IJ SOLN: 4.0000 mg | Freq: Three times a day (TID) | INTRAMUSCULAR | Status: DC | PRN

## 2014-01-27 MED ORDER — LACTULOSE 10 GM/15ML PO SOLN
30.0000 g | Freq: Once | ORAL | Status: DC
  Filled 2014-01-27: qty 45

## 2014-01-27 MED ORDER — ALUM & MAG HYDROXIDE-SIMETH 200-200-20 MG/5ML PO SUSP: 30.0000 mL | Freq: Four times a day (QID) | ORAL | Status: DC | PRN

## 2014-01-27 MED ORDER — SPIRONOLACTONE 100 MG PO TABS
100.0000 mg | ORAL_TABLET | Freq: Every day | ORAL | Status: DC
  Administered 2014-01-28 – 2014-01-30 (×3): 100 mg via ORAL
  Filled 2014-01-27 (×3): qty 1

## 2014-01-27 MED ORDER — SODIUM CHLORIDE 0.9 % IJ SOLN: 3.0000 mL | INTRAMUSCULAR | Status: DC | PRN

## 2014-01-27 MED ORDER — SODIUM CHLORIDE 0.9 % IV BOLUS (SEPSIS)
1000.0000 mL | Freq: Once | INTRAVENOUS | Status: AC
  Administered 2014-01-27: 1000 mL via INTRAVENOUS

## 2014-01-27 MED ORDER — NALOXONE HCL 0.4 MG/ML IJ SOLN
0.4000 mg | Freq: Once | INTRAMUSCULAR | Status: AC
  Administered 2014-01-27: 0.4 mg via INTRAVENOUS
  Filled 2014-01-27: qty 1

## 2014-01-27 MED ORDER — LEVOFLOXACIN IN D5W 750 MG/150ML IV SOLN
750.0000 mg | Freq: Once | INTRAVENOUS | Status: AC
  Administered 2014-01-27: 750 mg via INTRAVENOUS
  Filled 2014-01-27: qty 150

## 2014-01-27 MED ORDER — LACTULOSE ENEMA
300.0000 mL | Freq: Once | ORAL | Status: AC
  Administered 2014-01-27: 300 mL via RECTAL
  Filled 2014-01-27 (×2): qty 300

## 2014-01-27 MED ORDER — FUROSEMIDE 80 MG PO TABS
80.0000 mg | ORAL_TABLET | Freq: Two times a day (BID) | ORAL | Status: DC
  Filled 2014-01-27: qty 1

## 2014-01-27 MED ORDER — LORAZEPAM 2 MG/ML IJ SOLN
1.0000 mg | Freq: Once | INTRAMUSCULAR | Status: DC
  Filled 2014-01-27: qty 1

## 2014-01-27 MED ORDER — ACETAMINOPHEN 650 MG RE SUPP
650.0000 mg | Freq: Once | RECTAL | Status: AC
  Administered 2014-01-27: 650 mg via RECTAL
  Filled 2014-01-27: qty 1

## 2014-01-27 MED ORDER — SODIUM CHLORIDE 0.9 % IJ SOLN: 3.0000 mL | Freq: Two times a day (BID) | INTRAMUSCULAR | Status: DC

## 2014-01-27 MED ORDER — ONDANSETRON HCL 4 MG/2ML IJ SOLN: 4.0000 mg | Freq: Four times a day (QID) | INTRAMUSCULAR | Status: DC | PRN

## 2014-01-27 NOTE — ED Notes (Signed)
Attempted report 

## 2014-01-27 NOTE — ED Notes (Signed)
Patient repositioned with new sheet under patient. Patient attempting to get out of bed, putting finger in mouth, and biting call light cord.  Notified EDP ordered medication and will attempted rectal lactulose.

## 2014-01-27 NOTE — ED Notes (Addendum)
Dr Hyacinth MeekerMiller at bedside with 2 Nurses to perform insertion of NG tube.  Doctor attempted NG tube 57F unable to advance left nostril. Scant red blood occurred during insertion. Airway intact bilateral equal chest rise and fall.

## 2014-01-27 NOTE — ED Notes (Signed)
Doctor at bedside.
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
## 2014-01-27 NOTE — ED Notes (Signed)
Called respiratory concerning ABG.

## 2014-01-27 NOTE — Progress Notes (Signed)
ANTIBIOTIC CONSULT NOTE - INITIAL  Pharmacy Consult for Zosyn Indication: aspiration PNA  Allergies  Allergen Reactions  . Zestril [Lisinopril] Cough    Patient Measurements: Height: 5\' 6"  (167.6 cm) Weight: 176 lb 2.4 oz (79.9 kg) IBW/kg (Calculated) : 59.3 Adjusted Body Weight:    Vital Signs: Temp: 98 F (36.7 C) (12/14 1715) Temp Source: Axillary (12/14 1715) BP: 98/47 mmHg (12/14 1715) Pulse Rate: 86 (12/14 1715) Intake/Output from previous day:   Intake/Output from this shift: Total I/O In: -  Out: 50 [Urine:50]  Labs:  Recent Labs  01/27/14 1218 01/27/14 1245 01/27/14 1307  WBC 6.6  --   --   HGB 12.0  --  13.3  PLT 52*  --   --   CREATININE  --  1.14* 1.30*   Estimated Creatinine Clearance: 53.3 mL/min (by C-G formula based on Cr of 1.3). No results for input(s): VANCOTROUGH, VANCOPEAK, VANCORANDOM, GENTTROUGH, GENTPEAK, GENTRANDOM, TOBRATROUGH, TOBRAPEAK, TOBRARND, AMIKACINPEAK, AMIKACINTROU, AMIKACIN in the last 72 hours.   Microbiology: No results found for this or any previous visit (from the past 720 hour(s)).  Medical History: Past Medical History  Diagnosis Date  . Chronic back pain   . Depression   . Hypertension   . GERD (gastroesophageal reflux disease)   . Asthma   . Obesity   . Pre-diabetes   . COPD (chronic obstructive pulmonary disease)   . Ascites     Medications:  Prescriptions prior to admission  Medication Sig Dispense Refill Last Dose  . albuterol (PROAIR HFA) 108 (90 BASE) MCG/ACT inhaler Inhale 2 puffs into the lungs every 6 (six) hours as needed for wheezing or shortness of breath.   unknown  . ALPRAZolam (XANAX) 0.5 MG tablet Take 0.5 mg by mouth at bedtime as needed for anxiety.   Past Week at Unknown time  . aspirin EC 81 MG tablet Take 81 mg by mouth at bedtime.   Past Week at Unknown time  . furosemide (LASIX) 40 MG tablet Take 80 mg by mouth 2 (two) times daily.   Past Week at Unknown time  . hydrocortisone cream  1 % Apply 1 application topically 2 (two) times daily as needed for itching.   unknown at Unknown time  . metoprolol-hydrochlorothiazide (LOPRESSOR HCT) 50-25 MG per tablet Take 0.5 tablets by mouth at bedtime.    Past Week at Unknown time  . omeprazole (PRILOSEC) 40 MG capsule Take 40 mg by mouth daily.   Past Week at Unknown time  . Oxycodone HCl 10 MG TABS Take 30 mg by mouth every 6 (six) hours as needed (pain).   Past Week at Unknown time  . spironolactone (ALDACTONE) 100 MG tablet Take 100 mg by mouth daily.   Past Week at Unknown time   Assessment: AMS 53 y/o F brought to ED by family members due to lethargy and AMS with PMH significant for cirrhosis secondary to alcohol abuse, history of substance abuse, ascites requiring paracentesis. CXR significant for LLL infiltrate. Start abx for aspiration PNA.   Tmax 101.4, WBC 6.6, Scr elevated 1.3, CrCl 53.  Goal of Therapy:  Treatment of aspiration PNA  Plan:  Zosyn 3.375g IV q8hr. Pharmacy will sign off. Dose ok unless CrCl<20  Taneasha Fuqua S. Merilynn Finlandobertson, PharmD, BCPS Clinical Staff Pharmacist Pager [phone]  Misty Stanleyobertson, Jodette Wik Stillinger 01/27/2014,5:51 PM
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
## 2014-01-27 NOTE — ED Notes (Signed)
Family called EMS for patient lethargic and AMS for 2 days. Have been in bed for 2 days.

## 2014-01-27 NOTE — ED Notes (Signed)
EDP stated not a level 2 code sepsis

## 2014-01-27 NOTE — ED Notes (Signed)
Results of lactic acid told to primary nurse Silvio PateGreg N

## 2014-01-27 NOTE — ED Notes (Signed)
EMS reported patient ambulated with 2 person assist moves all extremities bilateral equal and strong.

## 2014-01-27 NOTE — Progress Notes (Signed)
Received patient from ED confused; making attempts to get OOB; Incontinent of stool from a lactulose enema. Placed on CCM; CHG completed and VS obtained. Placed on bed alarm. Family updated.

## 2014-01-28 ENCOUNTER — Inpatient Hospital Stay (HOSPITAL_COMMUNITY): Payer: Medicare PPO

## 2014-01-28 DIAGNOSIS — N390 Urinary tract infection, site not specified: Secondary | ICD-10-CM

## 2014-01-28 DIAGNOSIS — K7200 Acute and subacute hepatic failure without coma: Secondary | ICD-10-CM | POA: Insufficient documentation

## 2014-01-28 DIAGNOSIS — F1410 Cocaine abuse, uncomplicated: Secondary | ICD-10-CM | POA: Insufficient documentation

## 2014-01-28 DIAGNOSIS — K7682 Hepatic encephalopathy: Secondary | ICD-10-CM | POA: Insufficient documentation

## 2014-01-28 DIAGNOSIS — K7460 Unspecified cirrhosis of liver: Secondary | ICD-10-CM

## 2014-01-28 DIAGNOSIS — I517 Cardiomegaly: Secondary | ICD-10-CM

## 2014-01-28 DIAGNOSIS — D696 Thrombocytopenia, unspecified: Secondary | ICD-10-CM

## 2014-01-28 LAB — COMPREHENSIVE METABOLIC PANEL
ALT: 14 U/L (ref 0–35)
ANION GAP: 17 — AB (ref 5–15)
AST: 50 U/L — AB (ref 0–37)
Albumin: 2.7 g/dL — ABNORMAL LOW (ref 3.5–5.2)
Alkaline Phosphatase: 59 U/L (ref 39–117)
BILIRUBIN TOTAL: 6.2 mg/dL — AB (ref 0.3–1.2)
BUN: 24 mg/dL — ABNORMAL HIGH (ref 6–23)
CO2: 20 mEq/L (ref 19–32)
Calcium: 8.8 mg/dL (ref 8.4–10.5)
Chloride: 108 mEq/L (ref 96–112)
Creatinine, Ser: 1.22 mg/dL — ABNORMAL HIGH (ref 0.50–1.10)
GFR calc Af Amer: 58 mL/min — ABNORMAL LOW (ref >90)
GFR calc non Af Amer: 50 mL/min — ABNORMAL LOW (ref >90)
Glucose, Bld: 172 mg/dL — ABNORMAL HIGH (ref 70–99)
Potassium: 3.8 mEq/L (ref 3.7–5.3)
SODIUM: 145 meq/L (ref 137–147)
TOTAL PROTEIN: 7.3 g/dL (ref 6.0–8.3)

## 2014-01-28 LAB — CBC
HEMATOCRIT: 34.6 % — AB (ref 36.0–46.0)
HEMOGLOBIN: 11.3 g/dL — AB (ref 12.0–15.0)
MCH: 35.2 pg — AB (ref 26.0–34.0)
MCHC: 32.7 g/dL (ref 30.0–36.0)
MCV: 107.8 fL — AB (ref 78.0–100.0)
Platelets: 46 10*3/uL — ABNORMAL LOW (ref 150–400)
RBC: 3.21 MIL/uL — ABNORMAL LOW (ref 3.87–5.11)
RDW: 14.3 % (ref 11.5–15.5)
WBC: 4.8 10*3/uL (ref 4.0–10.5)

## 2014-01-28 LAB — PROTIME-INR
INR: 1.84 — AB (ref 0.00–1.49)
Prothrombin Time: 21.4 seconds — ABNORMAL HIGH (ref 11.6–15.2)

## 2014-01-28 LAB — AMMONIA: Ammonia: 42 umol/L (ref 11–60)

## 2014-01-28 MED ORDER — CETYLPYRIDINIUM CHLORIDE 0.05 % MT LIQD
7.0000 mL | Freq: Two times a day (BID) | OROMUCOSAL | Status: DC
  Administered 2014-01-28 – 2014-01-30 (×4): 7 mL via OROMUCOSAL

## 2014-01-28 MED ORDER — SODIUM CHLORIDE 0.45 % IV SOLN
INTRAVENOUS | Status: DC
  Administered 2014-01-28 (×2): via INTRAVENOUS

## 2014-01-28 MED ORDER — FUROSEMIDE 40 MG PO TABS
40.0000 mg | ORAL_TABLET | Freq: Two times a day (BID) | ORAL | Status: DC
  Administered 2014-01-28 – 2014-01-30 (×4): 40 mg via ORAL
  Filled 2014-01-28 (×5): qty 1

## 2014-01-28 NOTE — Progress Notes (Signed)
Transfer Note:   Arrival Method: via bed from 2C Mental Orientation: A&O x3, responds to voice; pt drowsy/lethargic Telemetry: Placed on box 6E07, ST Assessment: Completed Skin: Bruises bilateral arms/legs IV: Right FA wrapped with Kerlix NSL Pain: Denies Tubes: Foley Catheter intact, patent, draining amber urine. Cooling blanket applied. Safety Measures: Educated on fall prevention safety plan.  Admission: Completed [address] Orientation: Patient has been orientated to the room, unit and staff.  Family: N/A  Orders have been reviewed and implemented. Will continue to monitor the patient. Call light has been placed within reach and bed alarm has been activated.    Billy FischerLynn Zaley Talley, RN  Phone number: [phone]

## 2014-01-28 NOTE — Progress Notes (Signed)
Pt's mother was informed of transfer to 6E room 7,pt alert and oriented, vital signs stable.

## 2014-01-28 NOTE — Progress Notes (Signed)
[place] TEAM 1 - Stepdown/ICU TEAM Progress Note  Melissa ChewLou Ann Perris WUJ:[id number] DOB: 01/25/1961 DOA: 01/27/2014 PCP: Freddy JakschUTLAW,WILLIAM M, MD  Admit HPI / Brief Narrative: Melissa Stout is a 53 y.o. WF PMHx depression, liver cirrhosis secondary to alcohol abuse, Hx substance abuse, ascites requiring paracentesis, was brought to the emergent department by EMS found by family members this morning to be increasingly lethargic, having altered mental status, confused, disoriented, agitated. Symptoms worsening over the past 24 hours. History is obtained from family members were present at bedside and emergency room staff, patient encephalopathic, cannot provide history or participate in her own plan of care. Labs in the emergency room showed an ammonia level of 116, elevated total bilirubin of 6.6, creatinine 1.3 BUN of 26. A chest x-ray showed possible left lower lobe infiltrate. there was concerns for the possibility of aspiration pneumonia for which she was started on empiric IV antibiotics. Ethanol undetectable, positive for cocaine   HPI/Subjective: 12/15 A/O 3 (does not know why), negative CP, negative SOB. Requests to be allowed to eat.  Assessment/Plan: Acute Encephalopathy -Most likely multifactorial to include hepatic encephalopathy, cocaine abuse.  Hepatic encephalopathy.  -Patient with a past medical history of advanced liver disease, cirrhosis in setting of alcohol abuse, having a meld score of 22 -On admission ammonia level of 116. 12/15 ammonia level = 42  -DC lactulose, monitor daily ammonia level  -Heart healthy diet  Substance abuse  -Patient cocaine positive on UDS  -Echocardiogram; shows diastolic dysfunction see results below  Aspiration pneumonia.  -Given patient's history of all and cocaine abuse would treat with full seven-day course of antibiotics  -Swallow study pending -  UTI -Continue antibiotics  for aspiration pneumonia will cover most organisms for UTI. -0.45% normal saline 75 ml/hr  Coagulopathy.  -12/15 INR= 1.84, most likely from liver disease. Hold off on pharmacologic DVT prophylaxis, place on SCDs  Thrombocytopenia.  -Likely secondary to underlying liver disease, labs showing a platelet count of 52,000 -Would not transfuse platelets unless evidence of acute bleeding  Cirrhosis.  -Meld score 22 secondary to alcohol abuse. Patient follows Dr. Dulce Sellaroutlaw of gastroenterology in the outpatient setting.  -Patient is on  Lasix 80 mg by mouth twice a day and spironolactone 100 mg by mouth daily at home., Will decrease Lasix to 40 mg BID until taking adequate fluids, continue spironolactone at 100 mg daily when able to take PO   Code Status: FULL Family Communication: no family present at time of exam Disposition Plan: Resolution acute encephalopathy   Consultants: NA  Procedure/Significant Events: 12/14 PCXR;Mild atelectasis or infiltrate in LEFT lower lobe. 12/14 CT head without contrast; negative acute abnormality. 12/15 echocardiogram; LVEF=60% to 65%. -(grade 1 diastolic dysfunction). - Left atrium: The atrium was mildly dilated.   Culture 12/14 urine pending 12/14 MRSA by PCR negative 12/14 blood pending   Antibiotics: Levofloxacin 12/14  DVT prophylaxis: SCD   Devices    LINES / TUBES:      Continuous Infusions:   Objective: VITAL SIGNS: Temp: 101 F (38.3 C) (12/15 0735) Temp Source: Rectal (12/15 0735) BP: 130/86 mmHg (12/15 0800) Pulse Rate: 118 (12/15 0800) SPO2; FIO2:   Intake/Output Summary (Last 24 hours) at 01/28/14 0855 Last data filed at 01/28/14 0800  Gross per 24 hour  Intake    775 ml  Output    900 ml  Net   -125 ml     Exam: General: A/O 3 (does not know why), NAD, No acute respiratory distress Lungs:  Clear to auscultation bilaterally without wheezes or crackles Cardiovascular: Tachycardic, Regular rhythm without  murmur gallop or rub normal S1 and S2 Abdomen: Nontender, nondistended, soft, bowel sounds positive, no rebound, no ascites, no appreciable mass Extremities: No significant cyanosis, clubbing, or edema bilateral lower extremities  Data Reviewed: Basic Metabolic Panel:  Recent Labs Lab 01/27/14 1245 01/27/14 1307 01/28/14 0306  NA 140 141 145  K 4.0 3.8 3.8  CL 104 107 108  CO2 19  --  20  GLUCOSE 172* 178* 172*  BUN 25* 26* 24*  CREATININE 1.14* 1.30* 1.22*  CALCIUM 8.9  --  8.8   Liver Function Tests:  Recent Labs Lab 01/27/14 1245 01/28/14 0306  AST 47* 50*  ALT 14 14  ALKPHOS 68 59  BILITOT 6.6* 6.2*  PROT 7.6 7.3  ALBUMIN 2.9* 2.7*   No results for input(s): LIPASE, AMYLASE in the last 168 hours.  Recent Labs Lab 01/27/14 1254 01/28/14 0306  AMMONIA 116* 42   CBC:  Recent Labs Lab 01/27/14 1218 01/27/14 1307 01/28/14 0306  WBC 6.6  --  4.8  NEUTROABS 5.1  --   --   HGB 12.0 13.3 11.3*  HCT 36.0 39.0 34.6*  MCV 106.8*  --  107.8*  PLT 52*  --  46*   Cardiac Enzymes: No results for input(s): CKTOTAL, CKMB, CKMBINDEX, TROPONINI in the last 168 hours. BNP (last 3 results) No results for input(s): PROBNP in the last 8760 hours. CBG: No results for input(s): GLUCAP in the last 168 hours.  Recent Results (from the past 240 hour(s))  MRSA PCR Screening     Status: None   Collection Time: 01/27/14  5:40 PM  Result Value Ref Range Status   MRSA by PCR NEGATIVE NEGATIVE Final    Comment:        The GeneXpert MRSA Assay (FDA approved for NASAL specimens only), is one component of a comprehensive MRSA colonization surveillance program. It is not intended to diagnose MRSA infection nor to guide or monitor treatment for MRSA infections.      Studies:  Recent x-ray studies have been reviewed in detail by the Attending Physician  Scheduled Meds:  Scheduled Meds: . antiseptic oral rinse  7 mL Mouth Rinse BID  . furosemide  80 mg Oral BID  .  lactulose  300 mL Rectal TID  . piperacillin-tazobactam (ZOSYN)  IV  3.375 g Intravenous Q8H  . sodium chloride  3 mL Intravenous Q12H  . sodium chloride  3 mL Intravenous Q12H  . spironolactone  100 mg Oral Daily    Time spent on care of this patient: 40 mins   Drema DallasWOODS, Jeannette Maddy, J , MD   [facility] Office  [phone] Pager - [phone]  On-Call/Text Page:      [email or web address]      password [email or web address]  If 7PM-7AM, please contact night-coverage [email or web address] Password [email or web address] 01/28/2014, 8:55 AM   LOS: 1 day

## 2014-01-28 NOTE — Progress Notes (Signed)
Utilization Review Completed.Melissa Stout T12/15/2015  

## 2014-01-28 NOTE — Progress Notes (Signed)
  Echocardiogram 2D Echocardiogram has been performed.  Arvil ChacoFoster, Malya Cirillo 01/28/2014, 12:49 PM

## 2014-01-29 ENCOUNTER — Inpatient Hospital Stay (HOSPITAL_COMMUNITY): Admission: RE | Admit: 2014-01-29 | Payer: Medicare PPO | Source: Ambulatory Visit

## 2014-01-29 LAB — CBC WITH DIFFERENTIAL/PLATELET
Basophils Absolute: 0 10*3/uL (ref 0.0–0.1)
Basophils Relative: 1 % (ref 0–1)
Eosinophils Absolute: 0.1 10*3/uL (ref 0.0–0.7)
Eosinophils Relative: 1 % (ref 0–5)
HCT: 29.6 % — ABNORMAL LOW (ref 36.0–46.0)
HEMOGLOBIN: 9.7 g/dL — AB (ref 12.0–15.0)
Lymphocytes Relative: 22 % (ref 12–46)
Lymphs Abs: 0.9 10*3/uL (ref 0.7–4.0)
MCH: 33.7 pg (ref 26.0–34.0)
MCHC: 32.8 g/dL (ref 30.0–36.0)
MCV: 102.8 fL — ABNORMAL HIGH (ref 78.0–100.0)
MONO ABS: 0.7 10*3/uL (ref 0.1–1.0)
MONOS PCT: 15 % — AB (ref 3–12)
Neutro Abs: 2.6 10*3/uL (ref 1.7–7.7)
Neutrophils Relative %: 61 % (ref 43–77)
Platelets: 37 10*3/uL — ABNORMAL LOW (ref 150–400)
RBC: 2.88 MIL/uL — ABNORMAL LOW (ref 3.87–5.11)
RDW: 13.8 % (ref 11.5–15.5)
WBC: 4.3 10*3/uL (ref 4.0–10.5)

## 2014-01-29 LAB — COMPREHENSIVE METABOLIC PANEL
ALK PHOS: 51 U/L (ref 39–117)
ALT: 11 U/L (ref 0–35)
AST: 41 U/L — ABNORMAL HIGH (ref 0–37)
Albumin: 2.3 g/dL — ABNORMAL LOW (ref 3.5–5.2)
Anion gap: 14 (ref 5–15)
BILIRUBIN TOTAL: 3.9 mg/dL — AB (ref 0.3–1.2)
BUN: 29 mg/dL — ABNORMAL HIGH (ref 6–23)
CHLORIDE: 99 meq/L (ref 96–112)
CO2: 22 mEq/L (ref 19–32)
Calcium: 8.1 mg/dL — ABNORMAL LOW (ref 8.4–10.5)
Creatinine, Ser: 1.2 mg/dL — ABNORMAL HIGH (ref 0.50–1.10)
GFR calc non Af Amer: 51 mL/min — ABNORMAL LOW (ref >90)
GFR, EST AFRICAN AMERICAN: 59 mL/min — AB (ref >90)
GLUCOSE: 118 mg/dL — AB (ref 70–99)
POTASSIUM: 3.7 meq/L (ref 3.7–5.3)
SODIUM: 135 meq/L — AB (ref 137–147)
TOTAL PROTEIN: 6.2 g/dL (ref 6.0–8.3)

## 2014-01-29 LAB — AMMONIA: Ammonia: 61 umol/L — ABNORMAL HIGH (ref 11–60)

## 2014-01-29 LAB — MAGNESIUM: Magnesium: 1.6 mg/dL (ref 1.5–2.5)

## 2014-01-29 MED ORDER — PANTOPRAZOLE SODIUM 40 MG PO TBEC
40.0000 mg | DELAYED_RELEASE_TABLET | Freq: Every day | ORAL | Status: DC
  Administered 2014-01-29 – 2014-01-30 (×2): 40 mg via ORAL
  Filled 2014-01-29 (×2): qty 1

## 2014-01-29 MED ORDER — ALPRAZOLAM 0.5 MG PO TABS
0.5000 mg | ORAL_TABLET | Freq: Every evening | ORAL | Status: DC | PRN
  Administered 2014-01-29: 0.5 mg via ORAL
  Filled 2014-01-29: qty 1

## 2014-01-29 MED ORDER — LACTULOSE 10 GM/15ML PO SOLN
30.0000 g | Freq: Two times a day (BID) | ORAL | Status: DC
  Administered 2014-01-29 – 2014-01-30 (×3): 30 g via ORAL
  Filled 2014-01-29 (×4): qty 45

## 2014-01-29 MED ORDER — MAGNESIUM SULFATE 2 GM/50ML IV SOLN
2.0000 g | Freq: Once | INTRAVENOUS | Status: AC
  Administered 2014-01-29: 2 g via INTRAVENOUS
  Filled 2014-01-29 (×2): qty 50

## 2014-01-29 NOTE — Evaluation (Signed)
Physical Therapy Evaluation Patient Details Name: Melissa Stout MRN: [id number] DOB: 06/27/1960 Today's Date: 01/29/2014   History of Present Illness  Pt is a 53 y.o. female with a PMH of cirrhosis secondary to alcohol abuse, history of substance abuse, ascites requiring paracentesis, was brought to the emergent department by EMS found by family members this morning to be increasingly lethargic, having altered mental status, confused, disoriented, agitated. Symptoms worsening over the past 24 hours. In ED history is obtained from family members who were present at bedside and emergency room staff. Patient encephalopathic, could not provide history or participate in her own plan of care. A chest x-ray showed possible left lower lobe infiltrate. there was concerns for the possibility of aspiration pneumonia.  Clinical Impression  Pt admitted with above diagnosis. Pt currently with functional limitations due to the deficits listed below (see PT Problem List). At the time of PT eval pt states she is at baseline and that she does not need to be in the hospital anymore. Per nursing, pt still a little "foggy" at times and is not quite at baseline. Small balance disturbances noted during mobility, and would like to work with pt once more prior to d/c. Pt will benefit from skilled PT to increase their independence and safety with mobility to allow discharge to the venue listed below. Do not anticipate that pt will require PT follow up at d/c.      Follow Up Recommendations No PT follow up    Equipment Recommendations  None recommended by PT    Recommendations for Other Services       Precautions / Restrictions Precautions Precautions: Fall Restrictions Weight Bearing Restrictions: No      Mobility  Bed Mobility Overal bed mobility: Needs Assistance Bed Mobility: Supine to Sit     Supine to sit: Supervision     General bed mobility comments: Pt able to transition to EOB with no assistance  from therapist. Pt used bed rails and required increased time to negotiate covers and fix socks.   Transfers Overall transfer level: Needs assistance Equipment used: None Transfers: Sit to/from Stand Sit to Stand: Supervision         General transfer comment: Pt was able to power-up to full standing position without assistance. Slight unsteadiness noted however pt could self-correct.  Ambulation/Gait Ambulation/Gait assistance: Min guard Ambulation Distance (Feet): 480 Feet Assistive device: None Gait Pattern/deviations: WFL(Within Functional Limits) Gait velocity: Slightly decreased Gait velocity interpretation: Below normal speed for age/gender General Gait Details: Pt generally was able to ambulate well with no major balance disturbances. Pt did, however, demonstrate occasional small side side and increased ankle strategies to recover from mild instability/unsteadiness.   Stairs            Wheelchair Mobility    Modified Rankin (Stroke Patients Only)       Balance Overall balance assessment: Needs assistance Sitting-balance support: Feet supported;No upper extremity supported Sitting balance-Leahy Scale: Good     Standing balance support: No upper extremity supported;During functional activity Standing balance-Leahy Scale: Fair Standing balance comment: Noted unsteadiness however pt was able to correct without assist.                              Pertinent Vitals/Pain Pain Assessment: No/denies pain    Home Living Family/patient expects to be discharged to:: Private residence Living Arrangements: Alone Available Help at Discharge: Family;Available 24 hours/day Type of Home: House Home Access:  Level entry     Home Layout: One level Home Equipment: None      Prior Function Level of Independence: Independent               Hand Dominance   Dominant Hand: Right    Extremity/Trunk Assessment   Upper Extremity Assessment: Defer to  OT evaluation;Overall WFL for tasks assessed           Lower Extremity Assessment: Overall WFL for tasks assessed      Cervical / Trunk Assessment: Normal  Communication   Communication: No difficulties  Cognition Arousal/Alertness: Awake/alert Behavior During Therapy: WFL for tasks assessed/performed Overall Cognitive Status: Impaired/Different from baseline Area of Impairment: Safety/judgement;Awareness         Safety/Judgement: Decreased awareness of safety;Decreased awareness of deficits Awareness: Emergent   General Comments: Pt with noted decreased safety awareness.     General Comments      Exercises        Assessment/Plan    PT Assessment Patient needs continued PT services  PT Diagnosis Abnormality of gait   PT Problem List Decreased strength;Decreased range of motion;Decreased activity tolerance;Decreased balance;Decreased mobility;Decreased knowledge of use of DME;Decreased safety awareness;Decreased knowledge of precautions  PT Treatment Interventions DME instruction;Gait training;Stair training;Functional mobility training;Therapeutic activities;Therapeutic exercise;Neuromuscular re-education;Patient/family education   PT Goals (Current goals can be found in the Care Plan section) Acute Rehab PT Goals Patient Stated Goal: Return home today PT Goal Formulation: With patient Time For Goal Achievement: 02/05/14 Potential to Achieve Goals: Good    Frequency Min 3X/week   Barriers to discharge        Co-evaluation               End of Session Equipment Utilized During Treatment: Gait belt Activity Tolerance: Patient tolerated treatment well Patient left: in chair;with chair alarm set;with call bell/phone within reach Nurse Communication: Mobility status         Time: 1522-1540 PT Time Calculation (min) (ACUTE ONLY): 18 min   Charges:   PT Evaluation $Initial PT Evaluation Tier I: 1 Procedure PT Treatments $Gait Training: 8-22  mins   PT G Codes:          Conni SlipperKirkman, Carlette Palmatier 01/29/2014, 4:47 PM  Conni SlipperLaura Aubreyanna Dorrough, PT, DPT Acute Rehabilitation Services Pager: [phone]

## 2014-01-29 NOTE — Progress Notes (Addendum)
PATIENT DETAILS Name: Melissa Stout Age: 53 y.o. Sex: female Date of Birth: 06/19/1960 Admit Date: 01/27/2014 Admitting Physician Jeralyn BennettEzequiel Zamora, MD WUJ:WJXBJY,NWGNFAOPCP:OUTLAW,WILLIAM M, MD  Subjective: Wants to go home, is awake and alert.  Assessment/Plan: Principal Problem:   Hepatic encephalopathy:underlying liver cirrhoses, admitted and started on Lactulose, much improved. Will restart Lactulose and aim for 2-3 BM daily.Remove Foley, ambulte/PT eval  Active Problems:   SIR's:suspect secondary to Aspiration PNA. Much better on Zosyn, although has some ascites, abd is soft/non distended, doubt SBP. Continue Zosyn.Await Blood/Urine culture.    Aspiration PNA: Continue Zosyn. Await Blood/Urine culture.    Alcoholic Liver Cirrhoses:Unfortunately continues to drink ETOH (claims to drink 20 oz beer daily!), and smokes/snorts cocaine (atleast weekly per patient). She was counseled, not sure whether she intends to stop. She is fully aware of underlying liver cirrhoses and its life threatening/life disabling consequences. MELD score around 20!!Not much edema on exam, not much ascites on exam-some dullness at flanks-but soft-continue Diuretic regime.   Coagulopathy/Thrombocytopenia:secondary to above. Monitor periodically   Cocaine/ETOH ZHY:QMVHQIONG-EXBuse:counseled-see above.  Disposition: Remain inpatient  Antibiotics:  See below.   Anti-infectives    Start     Dose/Rate Route Frequency Ordered Stop   01/27/14 1900  piperacillin-tazobactam (ZOSYN) IVPB 3.375 g     3.375 g12.5 mL/hr over 240 Minutes Intravenous Every 8 hours 01/27/14 1750     01/27/14 1345  levofloxacin (LEVAQUIN) IVPB 750 mg     750 mg100 mL/hr over 90 Minutes Intravenous  Once 01/27/14 1336 01/27/14 1548      DVT Prophylaxis: SCD's  Code Status: Full code   Family Communication None at bedside-patient awake/alert-understands plan of care  Procedures:  None  CONSULTS:  None  Time spent 40 minutes-which includes  50% of the time with face-to-face with patient/ family and coordinating care related to the above assessment and plan.  MEDICATIONS: Scheduled Meds: . antiseptic oral rinse  7 mL Mouth Rinse BID  . furosemide  40 mg Oral BID  . lactulose  30 g Oral BID  . piperacillin-tazobactam (ZOSYN)  IV  3.375 g Intravenous Q8H  . sodium chloride  3 mL Intravenous Q12H  . sodium chloride  3 mL Intravenous Q12H  . spironolactone  100 mg Oral Daily   Continuous Infusions:  PRN Meds:.sodium chloride, alum & mag hydroxide-simeth, haloperidol lactate, ondansetron **OR** ondansetron (ZOFRAN) IV, sodium chloride    PHYSICAL EXAM: Vital signs in last 24 hours: Filed Vitals:   01/28/14 1500 01/28/14 1622 01/28/14 2057 01/29/14 0525  BP: 126/76 98/54 128/86 105/60  Pulse:  119 99 97  Temp:  98.7 F (37.1 C) 98.6 F (37 C) 98.4 F (36.9 C)  TempSrc:  Oral Oral Oral  Resp: 22 21 20 20   Height:      Weight:   79.901 kg (176 lb 2.4 oz)   SpO2:  95% 100% 97%    Weight change: 0.522 kg (1 lb 2.4 oz) Filed Weights   01/27/14 1208 01/27/14 1715 01/28/14 2057  Weight: 79.379 kg (175 lb) 79.9 kg (176 lb 2.4 oz) 79.901 kg (176 lb 2.4 oz)   Body mass index is 28.44 kg/(m^2).   Gen Exam: Awake and alert with clear speech.   Neck: Supple, No JVD.   Chest: B/L Clear.   CVS: S1 S2 Regular, no murmurs.  Abdomen: soft, BS +, non tender, non distended.  Extremities: no edema, lower extremities warm to touch. Neurologic: Non Focal.   Skin:  No Rash.   Wounds: N/A.    Intake/Output from previous day:  Intake/Output Summary (Last 24 hours) at 01/29/14 0934 Last data filed at 01/29/14 0546  Gross per 24 hour  Intake   2525 ml  Output   1900 ml  Net    625 ml     LAB RESULTS: CBC  Recent Labs Lab 01/27/14 1218 01/27/14 1307 01/28/14 0306 01/29/14 0623  WBC 6.6  --  4.8 4.3  HGB 12.0 13.3 11.3* 9.7*  HCT 36.0 39.0 34.6* 29.6*  PLT 52*  --  46* 37*  MCV 106.8*  --  107.8* 102.8*  MCH 35.6*   --  35.2* 33.7  MCHC 33.3  --  32.7 32.8  RDW 14.3  --  14.3 13.8  LYMPHSABS 0.6*  --   --  0.9  MONOABS 0.8  --   --  0.7  EOSABS 0.0  --   --  0.1  BASOSABS 0.0  --   --  0.0    Chemistries   Recent Labs Lab 01/27/14 1245 01/27/14 1307 01/28/14 0306 01/29/14 0623  NA 140 141 145 135*  K 4.0 3.8 3.8 3.7  CL 104 107 108 99  CO2 19  --  20 22  GLUCOSE 172* 178* 172* 118*  BUN 25* 26* 24* 29*  CREATININE 1.14* 1.30* 1.22* 1.20*  CALCIUM 8.9  --  8.8 8.1*  MG  --   --   --  1.6    CBG: No results for input(s): GLUCAP in the last 168 hours.  GFR Estimated Creatinine Clearance: 57.8 mL/min (by C-G formula based on Cr of 1.2).  Coagulation profile  Recent Labs Lab 01/27/14 1245 01/28/14 0306  INR 1.70* 1.84*    Cardiac Enzymes No results for input(s): CKMB, TROPONINI, MYOGLOBIN in the last 168 hours.  Invalid input(s): CK  Invalid input(s): POCBNP No results for input(s): DDIMER in the last 72 hours. No results for input(s): HGBA1C in the last 72 hours. No results for input(s): CHOL, HDL, LDLCALC, TRIG, CHOLHDL, LDLDIRECT in the last 72 hours. No results for input(s): TSH, T4TOTAL, T3FREE, THYROIDAB in the last 72 hours.  Invalid input(s): FREET3 No results for input(s): VITAMINB12, FOLATE, FERRITIN, TIBC, IRON, RETICCTPCT in the last 72 hours. No results for input(s): LIPASE, AMYLASE in the last 72 hours.  Urine Studies No results for input(s): UHGB, CRYS in the last 72 hours.  Invalid input(s): UACOL, UAPR, USPG, UPH, UTP, UGL, UKET, UBIL, UNIT, UROB, ULEU, UEPI, UWBC, URBC, UBAC, CAST, UCOM, BILUA  MICROBIOLOGY: Recent Results (from the past 240 hour(s))  MRSA PCR Screening     Status: None   Collection Time: 01/27/14  5:40 PM  Result Value Ref Range Status   MRSA by PCR NEGATIVE NEGATIVE Final    Comment:        The GeneXpert MRSA Assay (FDA approved for NASAL specimens only), is one component of a comprehensive MRSA colonization surveillance  program. It is not intended to diagnose MRSA infection nor to guide or monitor treatment for MRSA infections.     RADIOLOGY STUDIES/RESULTS: Ct Head Wo Contrast  01/27/2014   CLINICAL DATA:  Altered mental status.  Fever.  Confusion.  EXAM: CT HEAD WITHOUT CONTRAST  TECHNIQUE: Contiguous axial images were obtained from the base of the skull through the vertex without intravenous contrast.  COMPARISON:  None.  FINDINGS: Sinuses/Soft tissues: Mild motion degradation. Mucosal thickening of ethmoid air cells. Clear mastoid air cells.  Intracranial: No mass lesion,  hemorrhage, hydrocephalus, acute infarct, intra-axial, or extra-axial fluid collection.  IMPRESSION: 1.  No acute intracranial abnormality. 2. Sinus disease.   Electronically Signed   By: Jeronimo Greaves M.D.   On: 01/27/2014 13:46   Dg Chest Port 1 View  01/28/2014   CLINICAL DATA:  Pneumonia and shortness of breath.  EXAM: PORTABLE CHEST - 1 VIEW  COMPARISON:  Portable chest x-ray of January 27, 2014 at 12:24 p.m.  FINDINGS: The lungs are well-expanded. The retrocardiac region on the left is now clear. The heart and pulmonary vascularity are normal. There is no pleural effusion or pneumothorax. The bony thorax is unremarkable.  IMPRESSION: Improved appearance of the left lower lobe consistent with resolution of atelectasis. There is no acute cardiopulmonary abnormality noted today.   Electronically Signed   By: David  [place]   On: 01/28/2014 07:26   Dg Chest Port 1 View  01/27/2014   CLINICAL DATA:  Transient altered awareness, history hypertension, asthma, COPD, smoker  EXAM: PORTABLE CHEST - 1 VIEW  COMPARISON:  Portable exam 1224 hr compared to 03/17/2011  FINDINGS: Rotated to the RIGHT.  Normal heart size mediastinal contours and pulmonary vascularity.  Mild atelectasis or infiltrate in LEFT lower lobe.  Remaining lungs clear.  No pleural effusion or pneumothorax.  IMPRESSION: Mild atelectasis versus infiltrate in LEFT lower lobe on  rotated exam.   Electronically Signed   By: Ulyses Southward M.D.   On: 01/27/2014 13:06    Jeoffrey Massed, MD  [facility] Pager:[phone]  If 7PM-7AM, please contact night-coverage [email or web address] Password [email or web address] 01/29/2014, 9:34 AM   LOS: 2 days
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
## 2014-01-30 LAB — URINE CULTURE: Colony Count: >=100000

## 2014-01-30 MED ORDER — FUROSEMIDE 40 MG PO TABS: 40.0000 mg | ORAL_TABLET | Freq: Two times a day (BID) | ORAL | Status: DC

## 2014-01-30 MED ORDER — RIFAXIMIN 550 MG PO TABS
550.0000 mg | ORAL_TABLET | Freq: Two times a day (BID) | ORAL | Status: DC
  Administered 2014-01-30: 550 mg via ORAL
  Filled 2014-01-30 (×2): qty 1

## 2014-01-30 MED ORDER — LACTULOSE 10 GM/15ML PO SOLN: 30.0000 g | Freq: Two times a day (BID) | ORAL | Status: DC

## 2014-01-30 MED ORDER — AMOXICILLIN-POT CLAVULANATE 875-125 MG PO TABS: 1.0000 | ORAL_TABLET | Freq: Two times a day (BID) | ORAL | Status: DC

## 2014-01-30 MED ORDER — RIFAXIMIN 550 MG PO TABS: 550.0000 mg | ORAL_TABLET | Freq: Two times a day (BID) | ORAL | Status: DC

## 2014-01-30 NOTE — Discharge Summary (Signed)
PATIENT DETAILS Name: Melissa Stout Age: 53 y.o. Sex: female Date of Birth: 07/13/1960 MRN: [id number]. Admitting Physician: Jeralyn BennettEzequiel Zamora, MD WUJ:WJXBJY,NWGNFAOPCP:OUTLAW,WILLIAM M, MD  Admit Date: 01/27/2014 Discharge date: 01/30/2014  Recommendations for Outpatient Follow-up:  1. Have decreased dose of Lasix, discontinued metoprolol/HCTZ-BP controlled with just Lasix and Aldactone. 2. New medication-Started on lactulose/Xifaxan 3. Will need ongoing counseling regarding-complete abstinence from alcohol, and avoidance of further cocaine use. 4. Repeat CBC/CMET in 1 week 5. Please follow blood/urine cultures till final.  PRIMARY DISCHARGE DIAGNOSIS:  Principal Problem:   Hepatic encephalopathy Active Problems:   Sepsis   AKI (acute kidney injury)   Cirrhosis of liver with ascites   Coagulopathy   Aspiration pneumonia   Acute hepatic encephalopathy   Cocaine abuse   Symptomatic urinary tract infection   Thrombocytopenia      PAST MEDICAL HISTORY: Past Medical History  Diagnosis Date  . Chronic back pain   . Depression   . Hypertension   . GERD (gastroesophageal reflux disease)   . Asthma   . Obesity   . Pre-diabetes   . COPD (chronic obstructive pulmonary disease)   . Ascites     DISCHARGE MEDICATIONS: Current Discharge Medication List    START taking these medications   Details  amoxicillin-clavulanate (AUGMENTIN) 875-125 MG per tablet Take 1 tablet by mouth 2 (two) times daily. Qty: 14 tablet, Refills: 0    lactulose (CHRONULAC) 10 GM/15ML solution Take 45 mLs (30 g total) by mouth 2 (two) times daily. Please titrate dose to have 2-3 Bowel Movement a day Qty: 1892 mL, Refills: 0    rifaximin (XIFAXAN) 550 MG TABS tablet Take 1 tablet (550 mg total) by mouth 2 (two) times daily. Qty: 60 tablet, Refills: 0      CONTINUE these medications which have CHANGED   Details  furosemide (LASIX) 40 MG tablet Take 1 tablet (40 mg total) by mouth 2 (two) times daily. Qty: 30  tablet      CONTINUE these medications which have NOT CHANGED   Details  albuterol (PROAIR HFA) 108 (90 BASE) MCG/ACT inhaler Inhale 2 puffs into the lungs every 6 (six) hours as needed for wheezing or shortness of breath.    ALPRAZolam (XANAX) 0.5 MG tablet Take 0.5 mg by mouth at bedtime as needed for anxiety.    aspirin EC 81 MG tablet Take 81 mg by mouth at bedtime.    hydrocortisone cream 1 % Apply 1 application topically 2 (two) times daily as needed for itching.    omeprazole (PRILOSEC) 40 MG capsule Take 40 mg by mouth daily.    Oxycodone HCl 10 MG TABS Take 30 mg by mouth every 6 (six) hours as needed (pain).    spironolactone (ALDACTONE) 100 MG tablet Take 100 mg by mouth daily.      STOP taking these medications     metoprolol-hydrochlorothiazide (LOPRESSOR HCT) 50-25 MG per tablet         ALLERGIES:   Allergies  Allergen Reactions  . Zestril [Lisinopril] Cough    BRIEF HPI:  See H&P, Labs, Consult and Test reports for all details in brief, patient  is a 53 y.o. female with a past medical history of cirrhosis secondary to alcohol abuse, history of substance abuse, ascites requiring paracentesis, was brought to the emergent department by EMS found by family members this morning to be increasingly lethargic, having altered mental status, confused, disoriented, agitated.   CONSULTATIONS:   None  PERTINENT RADIOLOGIC STUDIES: Ct Head Wo  Contrast  01/27/2014   CLINICAL DATA:  Altered mental status.  Fever.  Confusion.  EXAM: CT HEAD WITHOUT CONTRAST  TECHNIQUE: Contiguous axial images were obtained from the base of the skull through the vertex without intravenous contrast.  COMPARISON:  None.  FINDINGS: Sinuses/Soft tissues: Mild motion degradation. Mucosal thickening of ethmoid air cells. Clear mastoid air cells.  Intracranial: No mass lesion, hemorrhage, hydrocephalus, acute infarct, intra-axial, or extra-axial fluid collection.  IMPRESSION: 1.  No acute  intracranial abnormality. 2. Sinus disease.   Electronically Signed   By: Jeronimo Greaves M.D.   On: 01/27/2014 13:46   Dg Chest Port 1 View  01/28/2014   CLINICAL DATA:  Pneumonia and shortness of breath.  EXAM: PORTABLE CHEST - 1 VIEW  COMPARISON:  Portable chest x-ray of January 27, 2014 at 12:24 p.m.  FINDINGS: The lungs are well-expanded. The retrocardiac region on the left is now clear. The heart and pulmonary vascularity are normal. There is no pleural effusion or pneumothorax. The bony thorax is unremarkable.  IMPRESSION: Improved appearance of the left lower lobe consistent with resolution of atelectasis. There is no acute cardiopulmonary abnormality noted today.   Electronically Signed   By: David  [place]   On: 01/28/2014 07:26   Dg Chest Port 1 View  01/27/2014   CLINICAL DATA:  Transient altered awareness, history hypertension, asthma, COPD, smoker  EXAM: PORTABLE CHEST - 1 VIEW  COMPARISON:  Portable exam 1224 hr compared to 03/17/2011  FINDINGS: Rotated to the RIGHT.  Normal heart size mediastinal contours and pulmonary vascularity.  Mild atelectasis or infiltrate in LEFT lower lobe.  Remaining lungs clear.  No pleural effusion or pneumothorax.  IMPRESSION: Mild atelectasis versus infiltrate in LEFT lower lobe on rotated exam.   Electronically Signed   By: Ulyses Southward M.D.   On: 01/27/2014 13:06     PERTINENT LAB RESULTS: CBC:  Recent Labs  01/28/14 0306 01/29/14 0623  WBC 4.8 4.3  HGB 11.3* 9.7*  HCT 34.6* 29.6*  PLT 46* 37*   CMET CMP     Component Value Date/Time   NA 135* 01/29/2014 0623   K 3.7 01/29/2014 0623   CL 99 01/29/2014 0623   CO2 22 01/29/2014 0623   GLUCOSE 118* 01/29/2014 0623   BUN 29* 01/29/2014 0623   CREATININE 1.20* 01/29/2014 0623   CALCIUM 8.1* 01/29/2014 0623   PROT 6.2 01/29/2014 0623   ALBUMIN 2.3* 01/29/2014 0623   AST 41* 01/29/2014 0623   ALT 11 01/29/2014 0623   ALKPHOS 51 01/29/2014 0623   BILITOT 3.9* 01/29/2014 0623   GFRNONAA  51* 01/29/2014 0623   GFRAA 59* 01/29/2014 0623    GFR Estimated Creatinine Clearance: 57.9 mL/min (by C-G formula based on Cr of 1.2). No results for input(s): LIPASE, AMYLASE in the last 72 hours. No results for input(s): CKTOTAL, CKMB, CKMBINDEX, TROPONINI in the last 72 hours. Invalid input(s): POCBNP No results for input(s): DDIMER in the last 72 hours. No results for input(s): HGBA1C in the last 72 hours. No results for input(s): CHOL, HDL, LDLCALC, TRIG, CHOLHDL, LDLDIRECT in the last 72 hours. No results for input(s): TSH, T4TOTAL, T3FREE, THYROIDAB in the last 72 hours.  Invalid input(s): FREET3 No results for input(s): VITAMINB12, FOLATE, FERRITIN, TIBC, IRON, RETICCTPCT in the last 72 hours. Coags:  Recent Labs  01/27/14 1245 01/28/14 0306  INR 1.70* 1.84*   Microbiology: Recent Results (from the past 240 hour(s))  Urine culture     Status: None (Preliminary result)  Collection Time: 01/27/14  1:09 PM  Result Value Ref Range Status   Specimen Description URINE, CATHETERIZED  Final   Special Requests NONE  Final   Culture  Setup Time   Final    01/27/2014 20:52 Performed at [facility] Count   Final    >=100,000 COLONIES/ML Performed at [facility]    Culture   Final    ESCHERICHIA COLI Performed at [facility]    Report Status PENDING  Incomplete  MRSA PCR Screening     Status: None   Collection Time: 01/27/14  5:40 PM  Result Value Ref Range Status   MRSA by PCR NEGATIVE NEGATIVE Final    Comment:        The GeneXpert MRSA Assay (FDA approved for NASAL specimens only), is one component of a comprehensive MRSA colonization surveillance program. It is not intended to diagnose MRSA infection nor to guide or monitor treatment for MRSA infections.   Culture, blood (routine x 2)     Status: None (Preliminary result)   Collection Time: 01/27/14  7:24 PM  Result Value Ref Range Status   Specimen Description BLOOD  LEFT ARM  Final   Special Requests BOTTLES DRAWN AEROBIC AND ANAEROBIC 10CC  Final   Culture  Setup Time   Final    01/28/2014 01:16 Performed at [facility]    Culture   Final           BLOOD CULTURE RECEIVED NO GROWTH TO DATE CULTURE WILL BE HELD FOR 5 DAYS BEFORE ISSUING A FINAL NEGATIVE REPORT Performed at [facility]    Report Status PENDING  Incomplete  Culture, blood (routine x 2)     Status: None (Preliminary result)   Collection Time: 01/27/14  8:19 PM  Result Value Ref Range Status   Specimen Description BLOOD RIGHT HAND  Final   Special Requests BOTTLES DRAWN AEROBIC ONLY 2CC  Final   Culture  Setup Time   Final    01/28/2014 01:15 Performed at [facility]    Culture   Final           BLOOD CULTURE RECEIVED NO GROWTH TO DATE CULTURE WILL BE HELD FOR 5 DAYS BEFORE ISSUING A FINAL NEGATIVE REPORT Note: Culture results may be compromised due to an inadequate volume of blood received in culture bottles. Performed at [facility]    Report Status PENDING  Incomplete     BRIEF HOSPITAL COURSE:  Hepatic encephalopathy:underlying liver cirrhoses, admitted and started on Lactulose,significantly improved, completely awake/alert at time of discharge. Will continue with Lactulose, Rifaximin on discharge. Patient instructed to titrate Lactulose dosing to have around for 2-3 BM daily.See by PT, no recommendations for home health.  Active Problems:  SIR's:suspect secondary to Aspiration PNA/UTI. Significantly improved Zosyn, although has minimal ascites on exam (only dullness at flanks), abd is soft/non distended, Highly doubt SBP.CXR on 12/14 consistent with PNA. . Will transition from Zosyn to Augmentin. Blood cx neg at time of discharge, Urine culture E coli, senisitivities pending at time of discharge. Please follow   Aspiration PNA: Much improved, afebrile, maintained on Zosyn, transition to Augmentin on discharge.   Alcoholic Liver  Cirrhoses:Unfortunately continues to drink ETOH (claims to drink 20 oz beer daily!), and smokes/snorts cocaine (atleast weekly per patient). She was counseled, not sure whether she intends to stop. She is fully aware of underlying liver cirrhoses and its life threatening/life disabling consequences. MELD score around  20!!No edema on exam, hardly any ascites on exam-some dullness at flanks-but soft/nondistended abd-continue Diuretic regime on discharge. She has a follow up appt with Dr Dulce Sellarutlaw next week, that she has been asked to keep.  Coagulopathy/Thrombocytopenia:secondary to above. Monitor periodically  Cocaine/ETOH WGN:FAOZHYQMV-HQIuse:counseled-see above.   TODAY-DAY OF DISCHARGE:  Subjective:   Tressie StalkerLou Vanderwoude today has no headache,no chest abdominal pain,no new weakness tingling or numbness, feels much better wants to go home today.   Objective:   Blood pressure 99/61, pulse 98, temperature 98.2 F (36.8 C), temperature source Oral, resp. rate 18, height 5\' 6"  (1.676 m), weight 80.1 kg (176 lb 9.4 oz), SpO2 96 %.  Intake/Output Summary (Last 24 hours) at 01/30/14 1036 Last data filed at 01/30/14 0900  Gross per 24 hour  Intake   1550 ml  Output      0 ml  Net   1550 ml   Filed Weights   01/27/14 1715 01/28/14 2057 01/29/14 2057  Weight: 79.9 kg (176 lb 2.4 oz) 79.901 kg (176 lb 2.4 oz) 80.1 kg (176 lb 9.4 oz)    Exam Awake Alert, Oriented *3, No new F.N deficits, Normal affect [place].AT,PERRAL Supple Neck,No JVD, No cervical lymphadenopathy appriciated.  Symmetrical Chest wall movement, Good air movement bilaterally, CTAB RRR,No Gallops,Rubs or new Murmurs, No Parasternal Heave +ve B.Sounds, Abd Soft, Non tender/not distended, No organomegaly appriciated, No rebound -guarding or rigidity. No Cyanosis, Clubbing or edema, No new Rash or bruise  DISCHARGE CONDITION: Stable  DISPOSITION: Home  DISCHARGE INSTRUCTIONS:    Activity:  As tolerated   Diet recommendation: Heart Healthy  diet  Discharge Instructions    Call MD for:  extreme fatigue    Complete by:  As directed      Call MD for:    Complete by:  As directed   Confusion,lethargy     Diet - low sodium heart healthy    Complete by:  As directed      Discharge instructions    Complete by:  As directed   Please avoid any further ETOH/Cocaine use!!!     Increase activity slowly    Complete by:  As directed            Follow-up Information    Follow up with Freddy JakschUTLAW,WILLIAM M, MD.   Specialty:  Gastroenterology   Why:  keep appt next week   Contact information:   [address] [place] [id number] [phone]      Total Time spent on discharge equals 45 minutes.  SignedJeoffrey Massed: GHIMIRE,SHANKER 01/30/2014 10:36 AM

## 2014-01-30 NOTE — Progress Notes (Signed)
Physical Therapy Treatment Patient Details Name: Melissa Stout MRN: [id number] DOB: 02/17/1960 Today's Date: 01/30/2014    History of Present Illness Pt is a 53 y.o. female with a PMH of cirrhosis secondary to alcohol abuse, history of substance abuse, ascites requiring paracentesis, was brought to the emergent department by EMS found by family members this morning to be increasingly lethargic, having altered mental status, confused, disoriented, agitated. Symptoms worsening over the past 24 hours. In ED history is obtained from family members who were present at bedside and emergency room staff. Patient encephalopathic, could not provide history or participate in her own plan of care. A chest x-ray showed possible left lower lobe infiltrate. there was concerns for the possibility of aspiration pneumonia.    PT Comments    Pt has shown improvement with mobility, and is able to perform transfers and ambulation with mod I and stairs with supervision. Pt does not report any questions or concerns with returning home at d/c. All goals have been completed, and pt is now discharged from PT services. If needs change, please reconsult.   Follow Up Recommendations  No PT follow up     Equipment Recommendations  None recommended by PT    Recommendations for Other Services Rehab consult     Precautions / Restrictions Precautions Precautions: Fall Restrictions Weight Bearing Restrictions: No    Mobility  Bed Mobility               General bed mobility comments: Pt sitting up in recliner upon PT arrival.   Transfers Overall transfer level: Needs assistance Equipment used: None Transfers: Sit to/from Stand Sit to Stand: Modified independent (Device/Increase time)         General transfer comment: Pt was able to power-up to full standing position without assistance.   Ambulation/Gait Ambulation/Gait assistance: Modified independent (Device/Increase time) Ambulation Distance  (Feet): 500 Feet Assistive device: None Gait Pattern/deviations: WFL(Within Functional Limits)   Gait velocity interpretation: at or above normal speed for age/gender General Gait Details: Pt generally was able to ambulate well with no major balance disturbances.    Stairs Stairs: Yes Stairs assistance: Supervision Stair Management: One rail Right Number of Stairs: 3 General stair comments: No physical assist  Wheelchair Mobility    Modified Rankin (Stroke Patients Only)       Balance Overall balance assessment: No apparent balance deficits (not formally assessed)                                  Cognition Arousal/Alertness: Awake/alert Behavior During Therapy: WFL for tasks assessed/performed Overall Cognitive Status: Within Functional Limits for tasks assessed Area of Impairment: Safety/judgement;Awareness         Safety/Judgement: Decreased awareness of safety;Decreased awareness of deficits     General Comments: Pt with mild decreased safety awareness.     Exercises      General Comments        Pertinent Vitals/Pain Pain Assessment: No/denies pain    Home Living                      Prior Function            PT Goals (current goals can now be found in the care plan section) Acute Rehab PT Goals Patient Stated Goal: Return home today PT Goal Formulation: With patient Time For Goal Achievement: 02/05/14 Potential to Achieve Goals: Good Progress towards PT goals:  Progressing toward goals    Frequency  Min 3X/week    PT Plan Current plan remains appropriate    Co-evaluation             End of Session   Activity Tolerance: Patient tolerated treatment well Patient left: in chair;with call bell/phone within reach;with family/visitor present     Time: 1051-1101 PT Time Calculation (min) (ACUTE ONLY): 10 min  Charges:  $Gait Training: 8-22 mins                    G Codes:      Conni SlipperKirkman, Yurika Pereda 01/30/2014, 1:59  PM  Conni SlipperLaura Domanik [place], PT, DPT Acute Rehabilitation Services Pager: [phone]

## 2014-01-30 NOTE — Progress Notes (Signed)
Patient Discharge:  Disposition: Pt discharged home with family  Education: Pt educated on medications, follow up appointments and all discharge instructions. Pt verbalized understanding  IV: Removed  Telemetry: Removed CCMD notified  Follow-up appointments:Reviewed  With pt  Prescriptions: Scripts given to pt  Transportation: Transported home with family   Belongings: All belongings taken with pt

## 2014-02-03 LAB — CULTURE, BLOOD (ROUTINE X 2)
CULTURE: NO GROWTH
CULTURE: NO GROWTH

## 2014-02-11 ENCOUNTER — Other Ambulatory Visit: Payer: Self-pay | Admitting: Gastroenterology

## 2014-02-11 DIAGNOSIS — K7030 Alcoholic cirrhosis of liver without ascites: Secondary | ICD-10-CM

## 2014-02-17 ENCOUNTER — Ambulatory Visit
Admission: RE | Admit: 2014-02-17 | Discharge: 2014-02-17 | Disposition: A | Discharge status: Home / Self Care | Payer: Commercial Managed Care - HMO | Source: Ambulatory Visit | Attending: Gastroenterology | Admitting: Gastroenterology

## 2014-02-17 DIAGNOSIS — K7460 Unspecified cirrhosis of liver: Secondary | ICD-10-CM | POA: Diagnosis not present

## 2014-02-17 DIAGNOSIS — K766 Portal hypertension: Secondary | ICD-10-CM | POA: Diagnosis not present

## 2014-02-17 DIAGNOSIS — K7030 Alcoholic cirrhosis of liver without ascites: Secondary | ICD-10-CM

## 2014-02-17 DIAGNOSIS — R161 Splenomegaly, not elsewhere classified: Secondary | ICD-10-CM | POA: Diagnosis not present

## 2014-02-21 DIAGNOSIS — Z23 Encounter for immunization: Secondary | ICD-10-CM | POA: Diagnosis not present

## 2014-02-24 ENCOUNTER — Ambulatory Visit (HOSPITAL_COMMUNITY): Payer: Commercial Managed Care - HMO

## 2014-03-05 ENCOUNTER — Ambulatory Visit (INDEPENDENT_AMBULATORY_CARE_PROVIDER_SITE_OTHER): Payer: Commercial Managed Care - HMO

## 2014-03-05 ENCOUNTER — Ambulatory Visit: Payer: Commercial Managed Care - HMO

## 2014-03-05 DIAGNOSIS — R928 Other abnormal and inconclusive findings on diagnostic imaging of breast: Secondary | ICD-10-CM | POA: Diagnosis not present

## 2014-03-05 DIAGNOSIS — Z1231 Encounter for screening mammogram for malignant neoplasm of breast: Secondary | ICD-10-CM | POA: Diagnosis not present

## 2014-03-10 ENCOUNTER — Other Ambulatory Visit: Payer: Self-pay | Admitting: Family Medicine

## 2014-03-10 DIAGNOSIS — R928 Other abnormal and inconclusive findings on diagnostic imaging of breast: Secondary | ICD-10-CM

## 2014-03-13 DIAGNOSIS — Z124 Encounter for screening for malignant neoplasm of cervix: Secondary | ICD-10-CM | POA: Diagnosis not present

## 2014-03-13 DIAGNOSIS — Z01419 Encounter for gynecological examination (general) (routine) without abnormal findings: Secondary | ICD-10-CM | POA: Diagnosis not present

## 2014-03-13 DIAGNOSIS — R87610 Atypical squamous cells of undetermined significance on cytologic smear of cervix (ASC-US): Secondary | ICD-10-CM | POA: Diagnosis not present

## 2014-03-17 ENCOUNTER — Ambulatory Visit
Admission: RE | Admit: 2014-03-17 | Discharge: 2014-03-17 | Disposition: A | Discharge status: Home / Self Care | Payer: Commercial Managed Care - HMO | Source: Ambulatory Visit | Attending: Family Medicine | Admitting: Family Medicine

## 2014-03-17 DIAGNOSIS — N63 Unspecified lump in breast: Secondary | ICD-10-CM | POA: Diagnosis not present

## 2014-03-17 DIAGNOSIS — R928 Other abnormal and inconclusive findings on diagnostic imaging of breast: Secondary | ICD-10-CM

## 2014-04-02 ENCOUNTER — Other Ambulatory Visit: Payer: Self-pay | Admitting: Internal Medicine

## 2014-07-04 ENCOUNTER — Other Ambulatory Visit: Payer: Self-pay | Admitting: Gastroenterology

## 2014-07-04 DIAGNOSIS — K7030 Alcoholic cirrhosis of liver without ascites: Secondary | ICD-10-CM | POA: Diagnosis not present

## 2014-07-09 DIAGNOSIS — M545 Low back pain: Secondary | ICD-10-CM | POA: Diagnosis not present

## 2014-07-09 DIAGNOSIS — K7460 Unspecified cirrhosis of liver: Secondary | ICD-10-CM | POA: Diagnosis not present

## 2014-07-09 DIAGNOSIS — F419 Anxiety disorder, unspecified: Secondary | ICD-10-CM | POA: Diagnosis not present

## 2014-07-09 DIAGNOSIS — J4520 Mild intermittent asthma, uncomplicated: Secondary | ICD-10-CM | POA: Diagnosis not present

## 2014-07-09 DIAGNOSIS — K219 Gastro-esophageal reflux disease without esophagitis: Secondary | ICD-10-CM | POA: Diagnosis not present

## 2014-07-09 DIAGNOSIS — I10 Essential (primary) hypertension: Secondary | ICD-10-CM | POA: Diagnosis not present

## 2014-08-20 ENCOUNTER — Ambulatory Visit
Admission: RE | Admit: 2014-08-20 | Discharge: 2014-08-20 | Disposition: A | Discharge status: Home / Self Care | Payer: Commercial Managed Care - HMO | Source: Ambulatory Visit | Attending: Gastroenterology | Admitting: Gastroenterology

## 2014-08-20 DIAGNOSIS — K7030 Alcoholic cirrhosis of liver without ascites: Secondary | ICD-10-CM

## 2014-08-20 DIAGNOSIS — K7460 Unspecified cirrhosis of liver: Secondary | ICD-10-CM | POA: Diagnosis not present

## 2014-08-20 DIAGNOSIS — R188 Other ascites: Secondary | ICD-10-CM | POA: Diagnosis not present

## 2014-10-08 DIAGNOSIS — F329 Major depressive disorder, single episode, unspecified: Secondary | ICD-10-CM | POA: Diagnosis not present

## 2014-10-08 DIAGNOSIS — K219 Gastro-esophageal reflux disease without esophagitis: Secondary | ICD-10-CM | POA: Diagnosis not present

## 2014-10-08 DIAGNOSIS — K7030 Alcoholic cirrhosis of liver without ascites: Secondary | ICD-10-CM | POA: Diagnosis not present

## 2014-10-08 DIAGNOSIS — D696 Thrombocytopenia, unspecified: Secondary | ICD-10-CM | POA: Diagnosis not present

## 2014-10-08 DIAGNOSIS — J4520 Mild intermittent asthma, uncomplicated: Secondary | ICD-10-CM | POA: Diagnosis not present

## 2014-10-08 DIAGNOSIS — I10 Essential (primary) hypertension: Secondary | ICD-10-CM | POA: Diagnosis not present

## 2014-10-08 DIAGNOSIS — M545 Low back pain: Secondary | ICD-10-CM | POA: Diagnosis not present

## 2014-10-08 DIAGNOSIS — F419 Anxiety disorder, unspecified: Secondary | ICD-10-CM | POA: Diagnosis not present

## 2014-10-29 DIAGNOSIS — I10 Essential (primary) hypertension: Secondary | ICD-10-CM | POA: Diagnosis not present

## 2014-11-12 DIAGNOSIS — I10 Essential (primary) hypertension: Secondary | ICD-10-CM | POA: Diagnosis not present

## 2014-11-12 DIAGNOSIS — Z23 Encounter for immunization: Secondary | ICD-10-CM | POA: Diagnosis not present

## 2014-11-12 DIAGNOSIS — R202 Paresthesia of skin: Secondary | ICD-10-CM | POA: Diagnosis not present

## 2014-11-12 DIAGNOSIS — R002 Palpitations: Secondary | ICD-10-CM | POA: Diagnosis not present

## 2015-01-05 DIAGNOSIS — R188 Other ascites: Secondary | ICD-10-CM | POA: Diagnosis not present

## 2015-01-05 DIAGNOSIS — K7031 Alcoholic cirrhosis of liver with ascites: Secondary | ICD-10-CM | POA: Diagnosis not present

## 2015-01-07 DIAGNOSIS — F329 Major depressive disorder, single episode, unspecified: Secondary | ICD-10-CM | POA: Diagnosis not present

## 2015-01-07 DIAGNOSIS — K219 Gastro-esophageal reflux disease without esophagitis: Secondary | ICD-10-CM | POA: Diagnosis not present

## 2015-01-07 DIAGNOSIS — F419 Anxiety disorder, unspecified: Secondary | ICD-10-CM | POA: Diagnosis not present

## 2015-01-07 DIAGNOSIS — M545 Low back pain: Secondary | ICD-10-CM | POA: Diagnosis not present

## 2015-01-07 DIAGNOSIS — N183 Chronic kidney disease, stage 3 (moderate): Secondary | ICD-10-CM | POA: Diagnosis not present

## 2015-01-07 DIAGNOSIS — R202 Paresthesia of skin: Secondary | ICD-10-CM | POA: Diagnosis not present

## 2015-01-07 DIAGNOSIS — J4520 Mild intermittent asthma, uncomplicated: Secondary | ICD-10-CM | POA: Diagnosis not present

## 2015-01-07 DIAGNOSIS — I10 Essential (primary) hypertension: Secondary | ICD-10-CM | POA: Diagnosis not present

## 2015-01-07 DIAGNOSIS — K7460 Unspecified cirrhosis of liver: Secondary | ICD-10-CM | POA: Diagnosis not present

## 2015-01-22 ENCOUNTER — Other Ambulatory Visit (HOSPITAL_COMMUNITY): Payer: Self-pay | Admitting: Gastroenterology

## 2015-01-22 ENCOUNTER — Ambulatory Visit (HOSPITAL_COMMUNITY): Payer: Commercial Managed Care - HMO

## 2015-01-22 DIAGNOSIS — R188 Other ascites: Secondary | ICD-10-CM

## 2015-02-02 ENCOUNTER — Other Ambulatory Visit (HOSPITAL_COMMUNITY): Payer: Self-pay | Admitting: Gastroenterology

## 2015-02-02 ENCOUNTER — Ambulatory Visit (HOSPITAL_COMMUNITY)
Admission: RE | Admit: 2015-02-02 | Discharge: 2015-02-02 | Disposition: A | Discharge status: Home / Self Care | Payer: Commercial Managed Care - HMO | Source: Ambulatory Visit | Attending: Gastroenterology | Admitting: Gastroenterology

## 2015-02-02 ENCOUNTER — Encounter (HOSPITAL_COMMUNITY)
Admission: RE | Admit: 2015-02-02 | Discharge: 2015-02-02 | Disposition: A | Discharge status: Home / Self Care | Payer: Commercial Managed Care - HMO | Source: Ambulatory Visit | Attending: Gastroenterology | Admitting: Gastroenterology

## 2015-02-02 ENCOUNTER — Encounter (HOSPITAL_COMMUNITY): Payer: Self-pay

## 2015-02-02 DIAGNOSIS — R188 Other ascites: Secondary | ICD-10-CM | POA: Insufficient documentation

## 2015-02-02 DIAGNOSIS — K7460 Unspecified cirrhosis of liver: Secondary | ICD-10-CM | POA: Diagnosis not present

## 2015-02-02 MED ORDER — SODIUM CHLORIDE 0.9 % IV SOLN
Freq: Once | INTRAVENOUS | Status: AC
Start: 2015-02-02 — End: 2015-02-02
  Administered 2015-02-02: 10:00:00 via INTRAVENOUS

## 2015-02-02 MED ORDER — ALBUMIN HUMAN 25 % IV SOLN
50.0000 g | Freq: Once | INTRAVENOUS | Status: AC
  Administered 2015-02-02: 50 g via INTRAVENOUS
  Filled 2015-02-02: qty 200

## 2015-02-02 NOTE — Discharge Instructions (Signed)
Albumin injection °What is this medicine? °ALBUMIN (al BYOO min) is used to treat or prevent shock following serious injury, bleeding, surgery, or burns by increasing the volume of blood plasma. This medicine can also replace low blood protein. °This medicine may be used for other purposes; ask your health care provider or pharmacist if you have questions. °What should I tell my health care provider before I take this medicine? °They need to know if you have any of the following conditions: °-anemia °-heart disease °-kidney disease °-an unusual or allergic reaction to albumin, other medicines, foods, dyes, or preservatives °-pregnant or trying to get pregnant °-breast-feeding °How should I use this medicine? °This medicine is for infusion into a vein. It is given by a health-care professional in a hospital or clinic. °Talk to your pediatrician regarding the use of this medicine in children. While this drug may be prescribed for selected conditions, precautions do apply. °Overdosage: If you think you have taken too much of this medicine contact a poison control center or emergency room at once. °NOTE: This medicine is only for you. Do not share this medicine with others. °What if I miss a dose? °This does not apply. °What may interact with this medicine? °Interactions are not expected. °This list may not describe all possible interactions. Give your health care provider a list of all the medicines, herbs, non-prescription drugs, or dietary supplements you use. Also tell them if you smoke, drink alcohol, or use illegal drugs. Some items may interact with your medicine. °What should I watch for while using this medicine? °Your condition will be closely monitored while you receive this medicine. °Some products are derived from human plasma, and there is a small risk that these products may contain certain types of virus or bacteria. All products are processed to kill most viruses and bacteria. If you have questions  concerning the risk of infections, discuss them with your doctor or health care professional. °What side effects may I notice from receiving this medicine? °Side effects that you should report to your doctor or health care professional as soon as possible: °-allergic reactions like skin rash, itching or hives, swelling of the face, lips, or tongue °-breathing problems °-changes in heartbeat °-fever, chills °-pain, redness or swelling at the injection site °-signs of viral infection including fever, drowsiness, chills, runny nose followed in about 2 weeks by a rash and joint pain °-tightness in the chest °Side effects that usually do not require medical attention (report to your doctor or health care professional if they continue or are bothersome): °-increased salivation °-nausea, vomiting °This list may not describe all possible side effects. Call your doctor for medical advice about side effects. You may report side effects to FDA at 1-800-FDA-1088. °Where should I keep my medicine? °This does not apply. You will not be given this medicine to store at home. °NOTE: This sheet is a summary. It may not cover all possible information. If you have questions about this medicine, talk to your doctor, pharmacist, or health care provider. °  °© 2016, [employer]. (2007-04-26 10:18:55) ° °

## 2015-02-02 NOTE — Progress Notes (Signed)
Patient presented today for US guided paracentesis. Upon reviewing images minimal to no ascites is seen, not amendable to percutaneous drainage. The patient was noted to have a right pleural effusion, oxygen saturation 98 % RA. This has been discussed today with Dr. Dulce Sellarutlaw who wishes to see the patient in the office prior to any thoracentesis. The patient was notified of this.   Pattricia BossKoreen Morgan PA-C Interventional Radiology  02/02/15  2:29 PM

## 2015-02-10 ENCOUNTER — Other Ambulatory Visit (HOSPITAL_COMMUNITY): Payer: Self-pay | Admitting: Gastroenterology

## 2015-02-10 DIAGNOSIS — J948 Other specified pleural conditions: Secondary | ICD-10-CM

## 2015-02-11 ENCOUNTER — Emergency Department (HOSPITAL_COMMUNITY): Payer: Commercial Managed Care - HMO

## 2015-02-11 ENCOUNTER — Encounter (HOSPITAL_COMMUNITY): Payer: Self-pay | Admitting: Emergency Medicine

## 2015-02-11 ENCOUNTER — Inpatient Hospital Stay (HOSPITAL_COMMUNITY)
Admission: EM | Admit: 2015-02-11 | Discharge: 2015-02-19 | DRG: 432 | Disposition: A | Discharge status: Home Health | Payer: Commercial Managed Care - HMO | Attending: Internal Medicine | Admitting: Internal Medicine

## 2015-02-11 ENCOUNTER — Inpatient Hospital Stay (HOSPITAL_COMMUNITY): Payer: Commercial Managed Care - HMO

## 2015-02-11 DIAGNOSIS — Z7982 Long term (current) use of aspirin: Secondary | ICD-10-CM | POA: Diagnosis not present

## 2015-02-11 DIAGNOSIS — J9600 Acute respiratory failure, unspecified whether with hypoxia or hypercapnia: Secondary | ICD-10-CM | POA: Diagnosis present

## 2015-02-11 DIAGNOSIS — R0602 Shortness of breath: Secondary | ICD-10-CM | POA: Diagnosis not present

## 2015-02-11 DIAGNOSIS — J9601 Acute respiratory failure with hypoxia: Secondary | ICD-10-CM

## 2015-02-11 DIAGNOSIS — R000 Tachycardia, unspecified: Secondary | ICD-10-CM | POA: Diagnosis present

## 2015-02-11 DIAGNOSIS — I110 Hypertensive heart disease with heart failure: Secondary | ICD-10-CM | POA: Diagnosis not present

## 2015-02-11 DIAGNOSIS — Z79899 Other long term (current) drug therapy: Secondary | ICD-10-CM

## 2015-02-11 DIAGNOSIS — N179 Acute kidney failure, unspecified: Secondary | ICD-10-CM | POA: Diagnosis present

## 2015-02-11 DIAGNOSIS — J948 Other specified pleural conditions: Secondary | ICD-10-CM | POA: Diagnosis not present

## 2015-02-11 DIAGNOSIS — N189 Chronic kidney disease, unspecified: Secondary | ICD-10-CM

## 2015-02-11 DIAGNOSIS — R7303 Prediabetes: Secondary | ICD-10-CM | POA: Diagnosis present

## 2015-02-11 DIAGNOSIS — R0600 Dyspnea, unspecified: Secondary | ICD-10-CM | POA: Diagnosis present

## 2015-02-11 DIAGNOSIS — K219 Gastro-esophageal reflux disease without esophagitis: Secondary | ICD-10-CM | POA: Diagnosis present

## 2015-02-11 DIAGNOSIS — J90 Pleural effusion, not elsewhere classified: Secondary | ICD-10-CM | POA: Diagnosis not present

## 2015-02-11 DIAGNOSIS — Z9889 Other specified postprocedural states: Secondary | ICD-10-CM | POA: Diagnosis present

## 2015-02-11 DIAGNOSIS — F329 Major depressive disorder, single episode, unspecified: Secondary | ICD-10-CM | POA: Diagnosis not present

## 2015-02-11 DIAGNOSIS — F17210 Nicotine dependence, cigarettes, uncomplicated: Secondary | ICD-10-CM | POA: Diagnosis not present

## 2015-02-11 DIAGNOSIS — J45909 Unspecified asthma, uncomplicated: Secondary | ICD-10-CM | POA: Diagnosis present

## 2015-02-11 DIAGNOSIS — K7031 Alcoholic cirrhosis of liver with ascites: Secondary | ICD-10-CM | POA: Diagnosis not present

## 2015-02-11 DIAGNOSIS — R188 Other ascites: Secondary | ICD-10-CM

## 2015-02-11 DIAGNOSIS — K7460 Unspecified cirrhosis of liver: Secondary | ICD-10-CM | POA: Diagnosis not present

## 2015-02-11 DIAGNOSIS — F419 Anxiety disorder, unspecified: Secondary | ICD-10-CM | POA: Diagnosis present

## 2015-02-11 DIAGNOSIS — IMO0001 Reserved for inherently not codable concepts without codable children: Secondary | ICD-10-CM

## 2015-02-11 DIAGNOSIS — Z981 Arthrodesis status: Secondary | ICD-10-CM | POA: Diagnosis not present

## 2015-02-11 DIAGNOSIS — R05 Cough: Secondary | ICD-10-CM | POA: Diagnosis not present

## 2015-02-11 DIAGNOSIS — G8929 Other chronic pain: Secondary | ICD-10-CM | POA: Diagnosis present

## 2015-02-11 DIAGNOSIS — R918 Other nonspecific abnormal finding of lung field: Secondary | ICD-10-CM | POA: Diagnosis not present

## 2015-02-11 DIAGNOSIS — J441 Chronic obstructive pulmonary disease with (acute) exacerbation: Secondary | ICD-10-CM | POA: Diagnosis not present

## 2015-02-11 DIAGNOSIS — M549 Dorsalgia, unspecified: Secondary | ICD-10-CM | POA: Diagnosis present

## 2015-02-11 DIAGNOSIS — R140 Abdominal distension (gaseous): Secondary | ICD-10-CM | POA: Diagnosis not present

## 2015-02-11 DIAGNOSIS — I5032 Chronic diastolic (congestive) heart failure: Secondary | ICD-10-CM | POA: Diagnosis not present

## 2015-02-11 DIAGNOSIS — E876 Hypokalemia: Secondary | ICD-10-CM | POA: Diagnosis present

## 2015-02-11 DIAGNOSIS — Z23 Encounter for immunization: Secondary | ICD-10-CM

## 2015-02-11 HISTORY — DX: Unspecified cirrhosis of liver: K74.60

## 2015-02-11 LAB — BODY FLUID CELL COUNT WITH DIFFERENTIAL
LYMPHS FL: 22 %
MONOCYTE-MACROPHAGE-SEROUS FLUID: 57 % (ref 50–90)
Neutrophil Count, Fluid: 21 % (ref 0–25)
WBC FLUID: 81 uL (ref 0–1000)

## 2015-02-11 LAB — LACTATE DEHYDROGENASE: LDH: 294 U/L — AB (ref 98–192)

## 2015-02-11 LAB — PROTIME-INR
INR: 1.74 — AB (ref 0.00–1.49)
INR: 1.9 — AB (ref 0.00–1.49)
PROTHROMBIN TIME: 20.3 s — AB (ref 11.6–15.2)
PROTHROMBIN TIME: 21.7 s — AB (ref 11.6–15.2)

## 2015-02-11 LAB — COMPREHENSIVE METABOLIC PANEL
ALK PHOS: 102 U/L (ref 38–126)
ALT: 22 U/L (ref 14–54)
AST: 88 U/L — AB (ref 15–41)
Albumin: 2.9 g/dL — ABNORMAL LOW (ref 3.5–5.0)
Anion gap: 9 (ref 5–15)
BILIRUBIN TOTAL: 5.9 mg/dL — AB (ref 0.3–1.2)
BUN: 5 mg/dL — AB (ref 6–20)
CALCIUM: 8.9 mg/dL (ref 8.9–10.3)
CHLORIDE: 105 mmol/L (ref 101–111)
CO2: 23 mmol/L (ref 22–32)
CREATININE: 1.05 mg/dL — AB (ref 0.44–1.00)
GFR, EST NON AFRICAN AMERICAN: 59 mL/min — AB (ref >60)
Glucose, Bld: 129 mg/dL — ABNORMAL HIGH (ref 65–99)
Potassium: 3.5 mmol/L (ref 3.5–5.1)
Sodium: 137 mmol/L (ref 135–145)
TOTAL PROTEIN: 7.8 g/dL (ref 6.5–8.1)

## 2015-02-11 LAB — CBC WITH DIFFERENTIAL/PLATELET
BASOS ABS: 0.1 10*3/uL (ref 0.0–0.1)
Basophils Relative: 1 %
EOS PCT: 3 %
Eosinophils Absolute: 0.2 10*3/uL (ref 0.0–0.7)
HEMATOCRIT: 32.4 % — AB (ref 36.0–46.0)
Hemoglobin: 11.4 g/dL — ABNORMAL LOW (ref 12.0–15.0)
LYMPHS PCT: 15 %
Lymphs Abs: 1 10*3/uL (ref 0.7–4.0)
MCH: 34.8 pg — ABNORMAL HIGH (ref 26.0–34.0)
MCHC: 35.2 g/dL (ref 30.0–36.0)
MCV: 98.8 fL (ref 78.0–100.0)
MONO ABS: 1 10*3/uL (ref 0.1–1.0)
MONOS PCT: 14 %
Neutro Abs: 4.8 10*3/uL (ref 1.7–7.7)
Neutrophils Relative %: 68 %
PLATELETS: 112 10*3/uL — AB (ref 150–400)
RBC: 3.28 MIL/uL — ABNORMAL LOW (ref 3.87–5.11)
RDW: 15 % (ref 11.5–15.5)
WBC: 7.1 10*3/uL (ref 4.0–10.5)

## 2015-02-11 LAB — GLUCOSE, SEROUS FLUID: Glucose, Fluid: 150 mg/dL

## 2015-02-11 LAB — LACTATE DEHYDROGENASE, PLEURAL OR PERITONEAL FLUID: LD, Fluid: 34 U/L — ABNORMAL HIGH (ref 3–23)

## 2015-02-11 LAB — PHOSPHORUS: PHOSPHORUS: 3.4 mg/dL (ref 2.5–4.6)

## 2015-02-11 LAB — PROTEIN, BODY FLUID

## 2015-02-11 LAB — APTT: aPTT: 36 seconds (ref 24–37)

## 2015-02-11 LAB — I-STAT TROPONIN, ED: Troponin i, poc: 0 ng/mL (ref 0.00–0.08)

## 2015-02-11 LAB — PROTEIN, TOTAL: Total Protein: 7.7 g/dL (ref 6.5–8.1)

## 2015-02-11 LAB — MAGNESIUM: MAGNESIUM: 2.3 mg/dL (ref 1.7–2.4)

## 2015-02-11 LAB — LACTIC ACID, PLASMA: Lactic Acid, Venous: 6.9 mmol/L (ref 0.5–2.0)

## 2015-02-11 LAB — BRAIN NATRIURETIC PEPTIDE: B NATRIURETIC PEPTIDE 5: 51.4 pg/mL (ref 0.0–100.0)

## 2015-02-11 MED ORDER — SODIUM CHLORIDE 0.9 % IJ SOLN
3.0000 mL | Freq: Two times a day (BID) | INTRAMUSCULAR | Status: DC
  Administered 2015-02-12 – 2015-02-16 (×6): 3 mL via INTRAVENOUS

## 2015-02-11 MED ORDER — FUROSEMIDE 10 MG/ML IJ SOLN
40.0000 mg | Freq: Two times a day (BID) | INTRAMUSCULAR | Status: DC
  Administered 2015-02-11 – 2015-02-12 (×2): 40 mg via INTRAVENOUS
  Filled 2015-02-11 (×2): qty 4

## 2015-02-11 MED ORDER — LACTULOSE 10 GM/15ML PO SOLN
30.0000 g | Freq: Two times a day (BID) | ORAL | Status: DC
  Administered 2015-02-12: 30 g via ORAL
  Filled 2015-02-11 (×2): qty 45

## 2015-02-11 MED ORDER — PANTOPRAZOLE SODIUM 40 MG PO TBEC
80.0000 mg | DELAYED_RELEASE_TABLET | Freq: Every day | ORAL | Status: DC
  Administered 2015-02-11 – 2015-02-18 (×8): 80 mg via ORAL
  Filled 2015-02-11 (×9): qty 2

## 2015-02-11 MED ORDER — SODIUM CHLORIDE 0.9 % IV BOLUS (SEPSIS)
500.0000 mL | Freq: Once | INTRAVENOUS | Status: AC
  Administered 2015-02-11: 500 mL via INTRAVENOUS

## 2015-02-11 MED ORDER — RIFAXIMIN 550 MG PO TABS: 550.0000 mg | ORAL_TABLET | Freq: Two times a day (BID) | ORAL | Status: DC

## 2015-02-11 MED ORDER — IPRATROPIUM-ALBUTEROL 0.5-2.5 (3) MG/3ML IN SOLN
RESPIRATORY_TRACT | Status: AC
  Administered 2015-02-11: 15:00:00
  Filled 2015-02-11: qty 3

## 2015-02-11 MED ORDER — LEVOFLOXACIN IN D5W 750 MG/150ML IV SOLN
750.0000 mg | Freq: Once | INTRAVENOUS | Status: DC
Start: 2015-02-11 — End: 2015-02-11

## 2015-02-11 MED ORDER — ACETAMINOPHEN 325 MG PO TABS
650.0000 mg | ORAL_TABLET | Freq: Four times a day (QID) | ORAL | Status: DC | PRN
  Administered 2015-02-12: 650 mg via ORAL
  Filled 2015-02-11: qty 2

## 2015-02-11 MED ORDER — ASPIRIN EC 81 MG PO TBEC
81.0000 mg | DELAYED_RELEASE_TABLET | Freq: Every day | ORAL | Status: DC
  Administered 2015-02-11 – 2015-02-18 (×8): 81 mg via ORAL
  Filled 2015-02-11 (×9): qty 1

## 2015-02-11 MED ORDER — SODIUM CHLORIDE 0.9 % IJ SOLN: 3.0000 mL | INTRAMUSCULAR | Status: DC | PRN

## 2015-02-11 MED ORDER — ONDANSETRON HCL 4 MG/2ML IJ SOLN: 4.0000 mg | Freq: Four times a day (QID) | INTRAMUSCULAR | Status: DC | PRN

## 2015-02-11 MED ORDER — SPIRONOLACTONE 100 MG PO TABS
100.0000 mg | ORAL_TABLET | Freq: Every day | ORAL | Status: DC
  Administered 2015-02-12 – 2015-02-18 (×7): 100 mg via ORAL
  Filled 2015-02-11: qty 1
  Filled 2015-02-11 (×3): qty 4
  Filled 2015-02-11 (×3): qty 1
  Filled 2015-02-11: qty 4
  Filled 2015-02-11 (×2): qty 1
  Filled 2015-02-11: qty 4
  Filled 2015-02-11: qty 1
  Filled 2015-02-11 (×2): qty 4

## 2015-02-11 MED ORDER — GUAIFENESIN ER 600 MG PO TB12
1200.0000 mg | ORAL_TABLET | Freq: Two times a day (BID) | ORAL | Status: DC
  Administered 2015-02-11 – 2015-02-16 (×10): 1200 mg via ORAL
  Filled 2015-02-11 (×10): qty 2

## 2015-02-11 MED ORDER — OXYCODONE HCL 5 MG PO TABS: 30.0000 mg | ORAL_TABLET | Freq: Four times a day (QID) | ORAL | Status: DC | PRN

## 2015-02-11 MED ORDER — ONDANSETRON HCL 4 MG PO TABS: 4.0000 mg | ORAL_TABLET | Freq: Four times a day (QID) | ORAL | Status: DC | PRN

## 2015-02-11 MED ORDER — SODIUM CHLORIDE 0.9 % IV SOLN: 250.0000 mL | INTRAVENOUS | Status: DC | PRN

## 2015-02-11 MED ORDER — ACETAMINOPHEN 650 MG RE SUPP: 650.0000 mg | Freq: Four times a day (QID) | RECTAL | Status: DC | PRN

## 2015-02-11 MED ORDER — LEVOFLOXACIN IN D5W 750 MG/150ML IV SOLN
750.0000 mg | INTRAVENOUS | Status: DC
  Administered 2015-02-12: 750 mg via INTRAVENOUS
  Filled 2015-02-11 (×2): qty 150

## 2015-02-11 MED ORDER — ALPRAZOLAM 0.5 MG PO TABS
0.5000 mg | ORAL_TABLET | Freq: Every evening | ORAL | Status: DC | PRN
Start: 2015-02-11 — End: 2015-02-19
  Administered 2015-02-12 – 2015-02-18 (×8): 0.5 mg via ORAL
  Filled 2015-02-11 (×8): qty 1

## 2015-02-11 MED ORDER — IPRATROPIUM-ALBUTEROL 0.5-2.5 (3) MG/3ML IN SOLN
3.0000 mL | RESPIRATORY_TRACT | Status: DC
  Administered 2015-02-11 – 2015-02-12 (×6): 3 mL via RESPIRATORY_TRACT
  Filled 2015-02-11 (×7): qty 3

## 2015-02-11 MED ORDER — IPRATROPIUM-ALBUTEROL 0.5-2.5 (3) MG/3ML IN SOLN
3.0000 mL | RESPIRATORY_TRACT | Status: DC | PRN
  Administered 2015-02-13 – 2015-02-16 (×5): 3 mL via RESPIRATORY_TRACT
  Filled 2015-02-11 (×5): qty 3

## 2015-02-11 MED ORDER — METHYLPREDNISOLONE SODIUM SUCC 125 MG IJ SOLR
60.0000 mg | Freq: Four times a day (QID) | INTRAMUSCULAR | Status: DC
  Administered 2015-02-11 – 2015-02-12 (×5): 60 mg via INTRAVENOUS
  Filled 2015-02-11 (×5): qty 2

## 2015-02-11 MED ORDER — SODIUM CHLORIDE 0.9 % IJ SOLN
3.0000 mL | Freq: Two times a day (BID) | INTRAMUSCULAR | Status: DC
  Administered 2015-02-11 – 2015-02-16 (×5): 3 mL via INTRAVENOUS

## 2015-02-11 MED ORDER — PANTOPRAZOLE SODIUM 40 MG PO TBEC: 80.0000 mg | DELAYED_RELEASE_TABLET | Freq: Every day | ORAL | Status: DC

## 2015-02-11 NOTE — ED Notes (Signed)
EKG complete, able to export but unable to print due to malfunction with the printer. Dr. Clydene PughKnott made aware. MD will look at EKG in MUSE.
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
## 2015-02-11 NOTE — Progress Notes (Signed)
CRITICAL VALUE ALERT  Critical value received:  Lactic Acid  Date of notification:  02/11/2015  Time of notification:  2115  Critical value read back: yes  Nurse who received alert:  Lisette AbuKaylee, RN  MD notified (1st page):  Dr. Clearence PedSchorr, MD  Time of first page:  2115  Responding MD:  Dr. Clearence PedSchorr, MD   Time MD responded:  2127

## 2015-02-11 NOTE — Progress Notes (Signed)
ANTIBIOTIC CONSULT NOTE - INITIAL  Pharmacy Consult for Levaquin Indication: COPD exacerbation  Allergies  Allergen Reactions  . Zestril [Lisinopril] Cough    Patient Measurements: Height: 5\' 6"  (167.6 cm) Weight: 160 lb (72.576 kg) IBW/kg (Calculated) : 59.3 Adjusted Body Weight:   Vital Signs: Temp: 98.3 F (36.8 C) (12/28 1435) Temp Source: Oral (12/28 1435) BP: 138/72 mmHg (12/28 1700) Pulse Rate: 111 (12/28 1700) Intake/Output from previous day:   Intake/Output from this shift:    Labs:  Recent Labs  02/11/15 1442  WBC 7.1  HGB 11.4*  PLT 112*  CREATININE 1.05*   Estimated Creatinine Clearance: 62.5 mL/min (by C-G formula based on Cr of 1.05). No results for input(s): VANCOTROUGH, VANCOPEAK, VANCORANDOM, GENTTROUGH, GENTPEAK, GENTRANDOM, TOBRATROUGH, TOBRAPEAK, TOBRARND, AMIKACINPEAK, AMIKACINTROU, AMIKACIN in the last 72 hours.   Microbiology: No results found for this or any previous visit (from the past 720 hour(s)).  Medical History: Past Medical History  Diagnosis Date  . Chronic back pain   . Depression   . Hypertension   . GERD (gastroesophageal reflux disease)   . Asthma   . Obesity   . Pre-diabetes   . COPD (chronic obstructive pulmonary disease) (HCC)   . Ascites   . Cirrhosis (HCC)     Medications:   (Not in a hospital admission) Scheduled:  . aspirin EC  81 mg Oral QHS  . guaiFENesin  1,200 mg Oral BID  . ipratropium-albuterol  3 mL Nebulization Q4H  . lactulose  30 g Oral BID  . methylPREDNISolone (SOLU-MEDROL) injection  60 mg Intravenous Q6H  . pantoprazole  80 mg Oral Daily  . rifaximin  550 mg Oral BID  . sodium chloride  3 mL Intravenous Q12H  . sodium chloride  3 mL Intravenous Q12H  . [START ON 02/12/2015] spironolactone  100 mg Oral Daily   Infusions:  . sodium chloride    . levofloxacin (LEVAQUIN) IV     Assessment: 54yo female with history of HTN, COPD, obesity and cirrhosis presents with SOB. Pharmacy is  consulted to dose levaquin for COPD exacerbation. Pt is afebrile, WBC 7.1, sCr 1.05.  Goal of Therapy:  Eradication of infection  Plan:  Levaquin 750mg  IV q24h Monitor renal function and clinical course  Arlean HoppingCorey M. Newman PiesBall, PharmD, BCPS Clinical Pharmacist Pager [phone] 02/11/2015,5:04 PM

## 2015-02-11 NOTE — Procedures (Signed)
Thoracentesis Procedure Note  Pre-operative Diagnosis: Large R pleural effusion  Post-operative Diagnosis: same  Indications: Acute respiratory failure with hypoxemia  Procedure Details  Consent: Informed consent was obtained. Risks of the procedure were discussed including: infection, bleeding, pain, pneumothorax.  Under sterile conditions the patient was positioned. Betadine solution and sterile drapes were utilized.  1% buffered lidocaine was used to anesthetize the 7th rib space. Fluid was obtained without any difficulties and minimal blood loss.  A dressing was applied to the wound and wound care instructions were provided.   Findings 1500 ml of clear pleural fluid was obtained. A sample was sent to Pathology for cytogenetics, flow, and cell counts, as well as for infection analysis.  Complications:  None; patient tolerated the procedure well.          Condition: stable  Plan A follow up chest x-ray was ordered. Bed Rest for 0 hours. Tylenol 650 mg. for pain.  Attending Attestation: I performed the procedure.  Heber CarolinaBrent Dorathea Faerber, MD [place] [facility] Pager: [phone] Cell: [phone] After 3pm or if no response, call [phone]

## 2015-02-11 NOTE — Progress Notes (Signed)
RT transported patient to 2H25. No complications. Vital signs stable at this time. Patient tolerated transport fairly well. RN at bedside. RT will continue to monitor.

## 2015-02-11 NOTE — ED Notes (Signed)
Pt informed RN that she has had fluid drained from abd once. And is scheduled for this Friday to have fluid drained from lungs. MD aware as well.

## 2015-02-11 NOTE — Consult Note (Signed)
PULMONARY / CRITICAL CARE MEDICINE   Name: Melissa Stout MRN: [id number] DOB: 09/03/1960    ADMISSION DATE:  02/11/2015 CONSULTATION DATE:  02/11/2015  REFERRING MD:  Dr. Konrad DoloresMerrell hospitalist  CHIEF COMPLAINT:  Shortness of breath  HISTORY OF PRESENT ILLNESS:   This is a 54 year old female with a past medical history significant for hepatic cirrhosis with ascites who has undergone one therapeutic paracentesis in the last 12 months who [facility] on 02/11/2015 complaining of 2 weeks of progressive shortness of breath. This is been associated with a dry cough. She denies fevers, chills, or chest pain. She has had some increasing swelling in her left leg. Her shortness of breath became so severe that she needed to come to the emergency department today. She was recently seen by her hepatologist Dr. Dulce Sellaroutlaw and arrangements were made for a paracentesis. Around that time she was noted to have some pleural fluid on the right side seen on imaging. She had never had this problem before. She was set up to have a thoracentesis on 02/13/2015, but she became too short of breath so she came to the emergency department today. Pulmonary and critical care medicine was consulted emergently for evaluation of acute respiratory failure with hypoxemia and an emergent thoracentesis.  PAST MEDICAL HISTORY :  She  has a past medical history of Chronic back pain; Depression; Hypertension; GERD (gastroesophageal reflux disease); Asthma; Obesity; Pre-diabetes; COPD (chronic obstructive pulmonary disease) (HCC); Ascites; and Cirrhosis (HCC).  PAST SURGICAL HISTORY: She  has past surgical history that includes Knee arthroscopy w/ internal fixation tibial spine fracture (Left, 1986); Lumbar disc surgery (1995); Rotator cuff repair; Lumbar fusion (2000); Colonoscopy (2015); parcentesis; Esophagogastroduodenoscopy (egd) with propofol (N/A, 09/25/2013); and EUS (N/A, 09/25/2013).  Allergies   Allergen Reactions  . Zestril [Lisinopril] Cough    No current facility-administered medications on file prior to encounter.   Current Outpatient Prescriptions on File Prior to Encounter  Medication Sig  . albuterol (PROAIR HFA) 108 (90 BASE) MCG/ACT inhaler Inhale 2 puffs into the lungs every 6 (six) hours as needed for wheezing or shortness of breath.  . ALPRAZolam (XANAX) 0.5 MG tablet Take 0.5 mg by mouth at bedtime as needed for anxiety.  Marland Kitchen. amoxicillin-clavulanate (AUGMENTIN) 875-125 MG per tablet Take 1 tablet by mouth 2 (two) times daily.  Marland Kitchen. aspirin EC 81 MG tablet Take 81 mg by mouth at bedtime.  . furosemide (LASIX) 40 MG tablet Take 1 tablet (40 mg total) by mouth 2 (two) times daily.  . hydrocortisone cream 1 % Apply 1 application topically 2 (two) times daily as needed for itching.  . lactulose (CHRONULAC) 10 GM/15ML solution Take 45 mLs (30 g total) by mouth 2 (two) times daily. Please titrate dose to have 2-3 Bowel Movement a day  . omeprazole (PRILOSEC) 40 MG capsule Take 40 mg by mouth daily.  . Oxycodone HCl 10 MG TABS Take 30 mg by mouth every 6 (six) hours as needed (pain).  . rifaximin (XIFAXAN) 550 MG TABS tablet Take 1 tablet (550 mg total) by mouth 2 (two) times daily.  Marland Kitchen. spironolactone (ALDACTONE) 100 MG tablet Take 100 mg by mouth daily.    FAMILY HISTORY:  Her has no family status information on file.   SOCIAL HISTORY: She  reports that she has been smoking Cigarettes.  She has a 17.5 pack-year smoking history. She does not have any smokeless tobacco history on file. She reports that she does not drink alcohol or  use illicit drugs.  REVIEW OF SYSTEMS COULD NOT OBTAIN DUE TO RESPIRATORY FAILURE  SUBJECTIVE  Respiratory distress  VITAL SIGNS: BP 138/72 mmHg  Pulse 111  Temp(Src) 98.3 F (36.8 C) (Oral)  Resp 24  Ht  (1.676 m)  Wt 72.576 kg (160 lb)  BMI 25.84 kg/m2  SpO2 100%  HEMODYNAMICS:    VENTILATOR SETTINGS: Vent Mode:  [-]  BIPAP;PSV FiO2 (%):  [100 %] 100 % Set Rate:  [10 bmp] 10 bmp PEEP:  [5 cmH20] 5 cmH20  INTAKE / OUTPUT:    PHYSICAL EXAMINATION: General:  Marked respiratory distress on BIPAP Neuro:  Awake, alert, oriented x4 HEENT:  BIPAP mask in place Cardiovascular:  Tachy, regular Lungs:  Diminished R, clear left, accessory muscle use Abdomen:  BS+, soft, nontender Musculoskeletal:  Normal bulk and tone Skin:  Slight jaundice, spider angiomas throughout, bruising throughout  LABS:  BMET  Recent Labs Lab 02/11/15 1442  NA 137  K 3.5  CL 105  CO2 23  BUN 5*  CREATININE 1.05*  GLUCOSE 129*    Electrolytes  Recent Labs Lab 02/11/15 1442  CALCIUM 8.9    CBC  Recent Labs Lab 02/11/15 1442  WBC 7.1  HGB 11.4*  HCT 32.4*  PLT 112*    Coag's No results for input(s): APTT, INR in the last 168 hours.  Sepsis Markers No results for input(s): LATICACIDVEN, PROCALCITON, O2SATVEN in the last 168 hours.  ABG No results for input(s): PHART, PCO2ART, PO2ART in the last 168 hours.  Liver Enzymes  Recent Labs Lab 02/11/15 1442  AST 88*  ALT 22  ALKPHOS 102  BILITOT 5.9*  ALBUMIN 2.9*    Cardiac Enzymes No results for input(s): TROPONINI, PROBNP in the last 168 hours.  Glucose No results for input(s): GLUCAP in the last 168 hours.  Imaging Dg Chest Port 1 View  02/11/2015  CLINICAL DATA:  Shortness of breath for 3 days EXAM: PORTABLE CHEST 1 VIEW COMPARISON:  January 28, 2014 FINDINGS: There is diffuse opacification of the right hemithorax. The left lung is clear. Heart size is normal. Pulmonary vascularity on the left is normal. Pulmonary vascularity on the right is obscured. There is no adenopathy in regions which can be assessed. IMPRESSION: Diffuse opacity of the right hemithorax. Suspect large pleural effusion with likely compressive atelectasis. Underlying right lung mass/and or consolidation cannot be excluded radiographically. Left lung clear. Cardiac  silhouette within normal limits. Electronically Signed   By: Bretta Bang III M.D.   On: 02/11/2015 14:46     STUDIES:  CXR > large R pleural effusion  CULTURES: December 28 pleural fluid culture sent for bacterial, fungal, AFB  ANTIBIOTICS: None  SIGNIFICANT EVENTS:   LINES/TUBES:   DISCUSSION: 54 year old female with a past medical history significant for hepatic cirrhosis and ascites presents to the emergency department with a large right-sided pleural effusion. 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 However, the differential diagnosis does include all the common causes of transudate and exudate. The best approach moving forward is to perform an emergent thoracentesis to relieve her work of breathing and also for diagnostic purposes.  ASSESSMENT / PLAN:  PULMONARY A: Acute respiratory failure with hypoxemia secondary to massive right-sided pleural effusion P:   Thoracentesis now > send for standard work up CXR afterwards BiPAP for now Monitor in stepdown status Monitor O2 saturation  She is critically ill from the pleural effusion but hopefully will do well with the emergent thoracentesis. Will follow.  My cc  time 35 minutes  Heber [place], MD [place] [facility] Pager: [phone] Cell: [phone] After 3pm or if no response, call [phone]  02/11/2015, 6:14 PM

## 2015-02-11 NOTE — ED Notes (Signed)
Attempted report x1. 

## 2015-02-11 NOTE — ED Notes (Signed)
Pt has had increasing  SOB over past few days- wheezing. 88% on room air. Pt placed on CPAP by EMS. EMS gave 125mg  Solu medrol, two albuterol tx and atrovent, Mag 2g, 100mL NS. BP 120/60, HR 120, Resp 30, CBG 102

## 2015-02-11 NOTE — ED Notes (Signed)
Pt placed on Bipap upon arrival to ED
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
## 2015-02-12 ENCOUNTER — Inpatient Hospital Stay (HOSPITAL_COMMUNITY): Payer: Commercial Managed Care - HMO

## 2015-02-12 DIAGNOSIS — Z9889 Other specified postprocedural states: Secondary | ICD-10-CM

## 2015-02-12 DIAGNOSIS — R188 Other ascites: Secondary | ICD-10-CM

## 2015-02-12 LAB — CBC
HCT: 29.9 % — ABNORMAL LOW (ref 36.0–46.0)
HEMOGLOBIN: 9.9 g/dL — AB (ref 12.0–15.0)
MCH: 33.3 pg (ref 26.0–34.0)
MCHC: 33.1 g/dL (ref 30.0–36.0)
MCV: 100.7 fL — ABNORMAL HIGH (ref 78.0–100.0)
PLATELETS: 79 10*3/uL — AB (ref 150–400)
RBC: 2.97 MIL/uL — AB (ref 3.87–5.11)
RDW: 14.8 % (ref 11.5–15.5)
WBC: 7.9 10*3/uL (ref 4.0–10.5)

## 2015-02-12 LAB — URINALYSIS, ROUTINE W REFLEX MICROSCOPIC
Bilirubin Urine: NEGATIVE
Glucose, UA: NEGATIVE mg/dL
Ketones, ur: NEGATIVE mg/dL
LEUKOCYTES UA: NEGATIVE
NITRITE: NEGATIVE
Protein, ur: NEGATIVE mg/dL
SPECIFIC GRAVITY, URINE: 1.009 (ref 1.005–1.030)
pH: 5.5 (ref 5.0–8.0)

## 2015-02-12 LAB — URINE MICROSCOPIC-ADD ON
BACTERIA UA: NONE SEEN
WBC UA: NONE SEEN WBC/hpf (ref 0–5)

## 2015-02-12 LAB — COMPREHENSIVE METABOLIC PANEL
ALBUMIN: 2.5 g/dL — AB (ref 3.5–5.0)
ALK PHOS: 84 U/L (ref 38–126)
ALT: 23 U/L (ref 14–54)
ANION GAP: 12 (ref 5–15)
AST: 80 U/L — ABNORMAL HIGH (ref 15–41)
BUN: 8 mg/dL (ref 6–20)
CALCIUM: 8.4 mg/dL — AB (ref 8.9–10.3)
CHLORIDE: 100 mmol/L — AB (ref 101–111)
CO2: 18 mmol/L — AB (ref 22–32)
Creatinine, Ser: 1.42 mg/dL — ABNORMAL HIGH (ref 0.44–1.00)
GFR calc non Af Amer: 41 mL/min — ABNORMAL LOW (ref >60)
GFR, EST AFRICAN AMERICAN: 48 mL/min — AB (ref >60)
GLUCOSE: 389 mg/dL — AB (ref 65–99)
Potassium: 3.5 mmol/L (ref 3.5–5.1)
SODIUM: 130 mmol/L — AB (ref 135–145)
Total Bilirubin: 4.9 mg/dL — ABNORMAL HIGH (ref 0.3–1.2)
Total Protein: 6.8 g/dL (ref 6.5–8.1)

## 2015-02-12 LAB — CREATININE, URINE, RANDOM: Creatinine, Urine: 35.47 mg/dL

## 2015-02-12 LAB — LACTIC ACID, PLASMA
Lactic Acid, Venous: 6.5 mmol/L (ref 0.5–2.0)
Lactic Acid, Venous: 5.4 mmol/L (ref 0.5–2.0)
Lactic Acid, Venous: 4.7 mmol/L (ref 0.5–2.0)

## 2015-02-12 LAB — PROTEIN, URINE, RANDOM: Total Protein, Urine: <6 mg/dL

## 2015-02-12 MED ORDER — FUROSEMIDE 10 MG/ML IJ SOLN
60.0000 mg | Freq: Two times a day (BID) | INTRAMUSCULAR | Status: DC
  Administered 2015-02-12 – 2015-02-13 (×2): 60 mg via INTRAVENOUS
  Filled 2015-02-12 (×2): qty 6

## 2015-02-12 MED ORDER — LEVOFLOXACIN IN D5W 750 MG/150ML IV SOLN: 750.0000 mg | INTRAVENOUS | Status: DC

## 2015-02-12 MED ORDER — LACTULOSE 10 GM/15ML PO SOLN
30.0000 g | Freq: Every day | ORAL | Status: DC
  Administered 2015-02-13 – 2015-02-16 (×4): 30 g via ORAL
  Filled 2015-02-12 (×4): qty 45

## 2015-02-12 MED ORDER — OXYCODONE HCL 5 MG PO TABS
10.0000 mg | ORAL_TABLET | Freq: Four times a day (QID) | ORAL | Status: DC | PRN
  Administered 2015-02-15 – 2015-02-18 (×2): 10 mg via ORAL
  Filled 2015-02-12 (×3): qty 2

## 2015-02-12 MED ORDER — ENSURE ENLIVE PO LIQD
237.0000 mL | ORAL | Status: DC
  Administered 2015-02-12 – 2015-02-16 (×3): 237 mL via ORAL

## 2015-02-12 MED ORDER — PNEUMOCOCCAL VAC POLYVALENT 25 MCG/0.5ML IJ INJ
0.5000 mL | INJECTION | INTRAMUSCULAR | Status: AC
  Administered 2015-02-13: 0.5 mL via INTRAMUSCULAR
  Filled 2015-02-12: qty 0.5

## 2015-02-12 MED ORDER — METOPROLOL TARTRATE 25 MG PO TABS
25.0000 mg | ORAL_TABLET | Freq: Two times a day (BID) | ORAL | Status: DC
  Administered 2015-02-12 – 2015-02-19 (×14): 25 mg via ORAL
  Filled 2015-02-12 (×14): qty 1

## 2015-02-12 MED ORDER — ALBUMIN HUMAN 25 % IV SOLN
50.0000 g | Freq: Once | INTRAVENOUS | Status: AC
  Administered 2015-02-12: 50 g via INTRAVENOUS
  Filled 2015-02-12: qty 200

## 2015-02-12 MED ORDER — METOPROLOL TARTRATE 12.5 MG HALF TABLET
12.5000 mg | ORAL_TABLET | Freq: Two times a day (BID) | ORAL | Status: DC
  Administered 2015-02-12: 12.5 mg via ORAL
  Filled 2015-02-12: qty 1

## 2015-02-12 NOTE — Progress Notes (Signed)
CRITICAL VALUE ALERT  Critical value received:  Lactic Acid   Date of notification:  02/12/2015  Time of notification:  0335  Critical value read back:yes  Nurse who received alert:  Foye ClockKristina, RN  MD notified (1st page):  Dr. Clearence PedSchorr, MD  Time of first page:  [phone]

## 2015-02-12 NOTE — Progress Notes (Signed)
Inpatient Diabetes Program Recommendations  AACE/ADA: New Consensus Statement on Inpatient Glycemic Control (2015)  Target Ranges:  Prepandial:   less than 140 mg/dL      Peak postprandial:   less than 180 mg/dL (1-2 hours)      Critically ill patients:  140 - 180 mg/dL   Review of Glycemic Control  Results for Ala BentEARMAN, Trinitey A (MRN [id number]) as of 02/12/2015 12:57  Ref. Range 02/12/2015 02:25  Glucose Latest Ref Range: 65-99 mg/dL 829389 (H)  Hyperglycemia likely d/t steroids.  Inpatient Diabetes Program Recommendations:  Correction (SSI): Add Novolog sensitive tidwc while on steroids.   Will follow. Thank you. Ailene Ardshonda Keryl Gholson, RD, LDN, CDE Inpatient Diabetes Coordinator [phone]

## 2015-02-12 NOTE — Progress Notes (Signed)
Utilization Review Completed.Melissa Stout T12/29/2016  

## 2015-02-12 NOTE — Progress Notes (Signed)
Initial Nutrition Assessment  DOCUMENTATION CODES:   Not applicable  INTERVENTION:   -Ensure Enlive po daily, each supplement provides 350 kcal and 20 grams of protein  NUTRITION DIAGNOSIS:   Increased nutrient needs related to chronic illness as evidenced by estimated needs   GOAL:   Patient will meet greater than or equal to 90% of their needs  MONITOR:   PO intake, Supplement acceptance, Labs, Weight trends, Skin, I & O's  REASON FOR ASSESSMENT:   Consult Assessment of nutrition requirement/status  ASSESSMENT:   54 year old female with a past medical history significant for hepatic cirrhosis and ascites presents to the emergency department with a large right-sided pleural effusion. 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 However, the differential diagnosis does include all the common causes of transudate and exudate. The best approach moving forward is to perform an emergent thoracentesis to relieve her work of breathing and also for diagnostic purposes.  Pt admitted with hepatic cirrhosis, and pleural effusion.   Spoke with RN, who confirmed that pt underwent thoracentesis on 02/11/15 for rt pleural effusion and potential plans for repeat thoracentesis. Pt has been eating well; consumed all of her breakfast this morning.   Spoke with pt at bedside, who reports poor appetite for 1 month PTA, due to ascites. She reveals her appetite has returned after undergoing thoracentesis. She reveals that meal intake was minimal prior to this- she shares that all she ate the past 2 days was 4 bites of fried fish and 3-4 sausage balls.   She reveals UBW around 155#, which she last weighed around last year and is a usual weight for her. She reveals that she gained weight earlier in the year due to fluid rentention, however, has lost approximately 10# (5.8%) over the past month due to poor po intake. Documented wt hx reveals a progressive wt loss over > 1 year, however, it is  difficult to assess weight changes due to fluid changes.   Pt reports feeling weak over the past month and activity level has decreased as a result of this. She endorses her clothing "being a tad bit looser". Nutrition-Focused physical exam completed. Findings are mild fat depletion, no muscle depletion, and no edema.   Discussed importance of good meal intake to promote healing.  Labs reviewed: Na: 130 (on IV supplementation).  Diet Order:  Diet NPO time specified  Skin:  Reviewed, no issues  Last BM:  02/11/15  Height:   Ht Readings from Last 1 Encounters:  02/11/15 5\' 6"  (1.676 m)    Weight:   Wt Readings from Last 1 Encounters:  02/11/15 160 lb (72.576 kg)    Ideal Body Weight:  59.1 kg  BMI:  Body mass index is 25.84 kg/(m^2).  Estimated Nutritional Needs:   Kcal:  1600-1800  Protein:  75-90 grams  Fluid:  1.6-1.8 L  EDUCATION NEEDS:   Education needs addressed  Colleen Donahoe A. Mayford KnifeWilliams, RD, LDN, CDE Pager: [phone] After hours Pager: [phone]

## 2015-02-12 NOTE — Progress Notes (Signed)
CRITICAL VALUE ALERT  Critical value received:  Lactic Acid  Date of notification:  02/12/2015  Time of notification:  0045  Critical value read back: yes  Nurse who received alert:  Foye ClockKristina, RN  MD notified (1st page):  Dr. Clearence PedSchorr, MD    Time of first page:  0045  Responding MD:  Dr. Clearence PedSchorr, MD  Time MD responded:  [phone] 02/12/2015

## 2015-02-12 NOTE — Progress Notes (Signed)
[facility] TEAM 1 - Stepdown/ICU TEAM Progress Note  Melissa Stout:[id number] DOB: 1960/03/15 DOA: 02/11/2015 PCP: Freddy Jaksch, MD  Admit HPI / Brief Narrative: 54 y.o. WF PMHx Depression, Chronic Back Pain, Prediabetes, COPD/Asthma, Liver Cirrhosis with Ascites,  Shortness of breath. Gradual onset. 4 days ago. Associated with wet cough. Albuterol without improvement. Wheezing. Denies fevers, chest pain, palpitations, dysuria, frequency, abdominal pain. Denies abdominal distention. Patient was evaluated in Dr. Hulen Shouts office 2 weeks ago for her hepatic cirrhosis. A small right pleural effusion was noted at that time and was scheduled for return evaluation on 02/13/2015.  HPI/Subjective: 12/29 A/O 4, NAD. States was on the liver transplant list however was taken off secondary to her continued smoking. Currently negative SOB, negative abdominal pain. Patient does have increased ascites, states last paracentesis~a year ago. Sees Dr. Dulce Sellar GI, but until this admission had not seen pulmonologist.   alAssessment/Plan: Acute respiratory failure with Hypoxia/Pleural Effusion  -12/28 S/P Thorocentesis; 1.5 L removed   : On BiPAP. Likely secondary to massive right pleural effusion and COPD exacerbation. Effusion concerning for malignancy but remote chance its related to cirrhosis/chf/infection. -12/28 S/P Thorocentesis; 1.5 L removed  - Continue Lasix 60 mg  BID; discussed with [facility] who will follow patient as outpatient. Patient still has some pleural effusion but not interested in repeat thoracentesis today  - Trend lactic acid; most likely secondary to large pleural effusion--> respiratory failure with hypoxia -Afebrile, negative leukocytosis, negative bands, negative last shift. Symptoms unlikely to be secondary to infection have stopped antibiotics  Chronic diastolic CHF/sinus tachycardia:  - increase Lasix IV 60 mg BID - Metoprolol 25 mg  BID    Hepatic Cirrhosis: MELD score 20.  Followed by Dr Outlaw/Ascites -Spironolactone 100 mg daily -Restart Lactulose 30 gm daily -Paracentesis attempted by IR however not sufficient fluid for procedure so aborted  -Albumin 50 gm to be administered one hour prior to paracentesis  GERD: - continue PPI  Anxiety: - continue xanax 0.5 mg PRN QHS   Code Status: FULL Family Communication: no family present at time of exam Disposition Plan: Next 24-48 hr     Consultants: Dr.Douglas Leandrew [facility] M    Procedure/Significant Events: 12/28 S/P Thorocentesis; 1.5 L removed    Culture 12/28 Rt Pleural Fluid NGTD 12/29 urine pending   Antibiotics: Levofloxacin 12/29 1dose   DVT prophylaxis: SCD   Devices    LINES / TUBES:      Continuous Infusions:   Objective: VITAL SIGNS: Temp: 98 F (36.7 C) (12/29 1705) Temp Source: Oral (12/29 1705) BP: 112/70 mmHg (12/29 1600) Pulse Rate: 111 (12/29 1937) SPO2; FIO2:   Intake/Output Summary (Last 24 hours) at 02/12/15 1953 Last data filed at 02/12/15 1800  Gross per 24 hour  Intake   2556 ml  Output   1700 ml  Net    856 ml     Exam: General:  A/O 4, NAD.No acute respiratory distress Eyes: Negative headache, eye pain, double vision,negative scleral hemorrhage ENT: Negative Runny nose, negative ear pain, negative gingival bleeding, Neck:  Negative scars, masses, torticollis, lymphadenopathy, JVD Lungs: absent RLE RLL breath sounds, remainder of lung clear to auscultation, negative wheezes or crackles Cardiovascular:  tachycardic, Regular rhythm without murmur gallop or rub normal S1 and S2 Abdomen:negative abdominal pain,  positive distention, negative tenderness, positive bowel wave, positive soft, bowel sounds, no rebound, positive  ascites, no appreciable mass Extremities: No significant cyanosis, clubbing, or edema bilateral lower extremities Psychiatric:  Negative depression,  negative anxiety, negative fatigue, negative mania  Neurologic:   Cranial nerves II through XII intact, tongue/uvula midline, all extremities muscle strength 5/5, sensation intact throughout, negative dysarthria, negative expressive aphasia, negative receptive aphasia.   Data Reviewed: Basic Metabolic Panel:  Recent Labs Lab 02/11/15 1442 02/11/15 1851 02/12/15 0225  NA 137  --  130*  K 3.5  --  3.5  CL 105  --  100*  CO2 23  --  18*  GLUCOSE 129*  --  389*  BUN 5*  --  8  CREATININE 1.05*  --  1.42*  CALCIUM 8.9  --  8.4*  MG  --  2.3  --   PHOS  --  3.4  --    Liver Function Tests:  Recent Labs Lab 02/11/15 1442 02/11/15 1851 02/12/15 0225  AST 88*  --  80*  ALT 22  --  23  ALKPHOS 102  --  84  BILITOT 5.9*  --  4.9*  PROT 7.8 7.7 6.8  ALBUMIN 2.9*  --  2.5*   No results for input(s): LIPASE, AMYLASE in the last 168 hours. No results for input(s): AMMONIA in the last 168 hours. CBC:  Recent Labs Lab 02/11/15 1442 02/12/15 0225  WBC 7.1 7.9  NEUTROABS 4.8  --   HGB 11.4* 9.9*  HCT 32.4* 29.9*  MCV 98.8 100.7*  PLT 112* 79*   Cardiac Enzymes: No results for input(s): CKTOTAL, CKMB, CKMBINDEX, TROPONINI in the last 168 hours. BNP (last 3 results)  Recent Labs  02/11/15 1442  BNP 51.4    ProBNP (last 3 results) No results for input(s): PROBNP in the last 8760 hours.  CBG: No results for input(s): GLUCAP in the last 168 hours.  Recent Results (from the past 240 hour(s))  Body fluid culture     Status: None (Preliminary result)   Collection Time: 02/11/15  7:35 PM  Result Value Ref Range Status   Specimen Description PLEURAL RIGHT FLUID  Final   Special Requests NONE  Final   Gram Stain   Final    RARE WBC PRESENT,BOTH PMN AND MONONUCLEAR NO ORGANISMS SEEN    Culture NO GROWTH < 12 HOURS  Final   Report Status PENDING  Incomplete     Studies:  Recent x-ray studies have been reviewed in detail by the Attending Physician  Scheduled Meds:  Scheduled Meds: . aspirin EC  81 mg Oral QHS  . feeding  supplement (ENSURE ENLIVE)  237 mL Oral Q24H  . furosemide  60 mg Intravenous BID  . guaiFENesin  1,200 mg Oral BID  . [START ON 02/13/2015] lactulose  30 g Oral Daily  . metoprolol tartrate  25 mg Oral BID  . pantoprazole  80 mg Oral QHS  . [START ON 02/13/2015] pneumococcal 23 valent vaccine  0.5 mL Intramuscular Tomorrow-1000  . sodium chloride  3 mL Intravenous Q12H  . sodium chloride  3 mL Intravenous Q12H  . spironolactone  100 mg Oral Daily    Time spent on care of this patient: 40 mins   Krisna Omar, Roselind MessierURTIS J , MD  [facility] Office  [phone] Pager - [phone]  On-Call/Text Page:      [email or web address]      password [email or web address]  If 7PM-7AM, please contact night-coverage [email or web address] Password [email or web address] 02/12/2015, 7:53 PM   LOS: 1 day   Care during the described time interval was provided by me .  I have reviewed this patient's available data, including medical history, events of note,  physical examination, and all test results as part of my evaluation. I have personally reviewed and interpreted all radiology studies.   Dia Crawford, MD [phone] Pager

## 2015-02-12 NOTE — Progress Notes (Signed)
Patient ID: Melissa Stout, female   DOB: 04/04/1960, 54 y.o.   MRN: [id number]   US Limited Abd performed  No ascites noted  No paracentesis performed today Pt sent back to room

## 2015-02-12 NOTE — Evaluation (Signed)
Physical Therapy Evaluation Patient Details Name: SEBASTIAN DZIK MRN: [id number] DOB: 04-22-1960 Today's Date: 02/12/2015   History of Present Illness  Admit with Right pleural effusion  Clinical Impression  Pt admitted with above diagnosis. Pt currently with functional limitations due to the deficits listed below (see PT Problem List). Pt able to ambulate with DOE 3/4 with sats to 86% on RA.  REcovered to 95% within 30 seconds of rest.  Will follow acutely.  Has good family support. Will need walking ambulation O2 after paracentesis to assess need for home O2.   Pt will benefit from skilled PT to increase their independence and safety with mobility to allow discharge to the venue listed below.      Follow Up Recommendations Home health PT;Supervision/Assistance - 24 hour [facility])    Equipment Recommendations  Other (comment) (TBA)    Recommendations for Other Services       Precautions / Restrictions Precautions Precautions: Fall Restrictions Weight Bearing Restrictions: No      Mobility  Bed Mobility Overal bed mobility: Independent                Transfers Overall transfer level: Independent                  Ambulation/Gait Ambulation/Gait assistance: Supervision Ambulation Distance (Feet): 225 Feet Assistive device: None Gait Pattern/deviations: WFL(Within Functional Limits)   Gait velocity interpretation: <1.8 ft/sec, indicative of risk for recurrent falls General Gait Details: Pt without LOB.  DOE 3/4 at end of walk with sats to 86% on RA.    Stairs            Wheelchair Mobility    Modified Rankin (Stroke Patients Only)       Balance Overall balance assessment: Needs assistance         Standing balance support: No upper extremity supported;During functional activity Standing balance-Leahy Scale: Fair Standing balance comment: good static balance.                               Pertinent Vitals/Pain Pain Assessment:  No/denies pain' See comments above for sats.     Home Living Family/patient expects to be discharged to:: Private residence Living Arrangements: Parent;Non-relatives/Friends (mom and boyfriend) Available Help at Discharge: Family;Available 24 hours/day Type of Home: House Home Access: Stairs to enter Entrance Stairs-Rails: None Entrance Stairs-Number of Steps: 4 Home Layout: One level Home Equipment: None      Prior Function Level of Independence: Independent               Hand Dominance        Extremity/Trunk Assessment   Upper Extremity Assessment: Defer to OT evaluation           Lower Extremity Assessment: Generalized weakness      Cervical / Trunk Assessment: Normal  Communication   Communication: No difficulties  Cognition Arousal/Alertness: Awake/alert Behavior During Therapy: WFL for tasks assessed/performed Overall Cognitive Status: Within Functional Limits for tasks assessed                      General Comments      Exercises General Exercises - Lower Extremity Ankle Circles/Pumps: AROM;Both;10 reps;Seated Long Arc Quad: AROM;Both;10 reps;Seated      Assessment/Plan    PT Assessment Patient needs continued PT services  PT Diagnosis Generalized weakness   PT Problem List Decreased activity tolerance;Decreased balance;Decreased mobility;Decreased knowledge of use of DME;Decreased  safety awareness;Decreased knowledge of precautions  PT Treatment Interventions DME instruction;Gait training;Functional mobility training;Therapeutic activities;Therapeutic exercise;Balance training;Patient/family education;Stair training   PT Goals (Current goals can be found in the Care Plan section) Acute Rehab PT Goals Patient Stated Goal: to gohome PT Goal Formulation: With patient Time For Goal Achievement: 02/19/15 Potential to Achieve Goals: Good    Frequency Min 3X/week   Barriers to discharge        Co-evaluation                End of Session Equipment Utilized During Treatment: Gait belt Activity Tolerance: Patient limited by fatigue Patient left: in bed;with call bell/phone within reach;with family/visitor present Nurse Communication: Mobility status         Time: 1310-1326 PT Time Calculation (min) (ACUTE ONLY): 16 min   Charges:   PT Evaluation $Initial PT Evaluation Tier I: 1 Procedure     PT G CodesBerline Lopes:        Janith Nielson F 02/12/2015, 2:19 PM Arthur Speagle [facility],PT Acute Rehabilitation [phone] (pager)

## 2015-02-12 NOTE — Progress Notes (Signed)
PULMONARY / CRITICAL CARE MEDICINE   Name: Melissa Stout MRN: [id number] DOB: 12/29/1960    ADMISSION DATE:  02/11/2015 CONSULTATION DATE:  02/11/2015  REFERRING MD:  Dr. Konrad DoloresMerrell hospitalist  CHIEF COMPLAINT:  Shortness of breath  BRIEF PATIENT DESCRIPTION:   This is a 54 year old female with cirrhosis who presented with a new R pleural effusion in the setting of ascites.  Required emergent thoracentesis on 12/28.   SUBJECTIVE: Feels much better, on room air  Respiratory distress  VITAL SIGNS: BP 119/70 mmHg  Pulse 114  Temp(Src) 97.7 F (36.5 C) (Oral)  Resp 18  Ht 5\' 6"  (1.676 m)  Wt 72.576 kg (160 lb)  BMI 25.84 kg/m2  SpO2 96%  HEMODYNAMICS:    VENTILATOR SETTINGS: Vent Mode:  [-] BIPAP;PSV FiO2 (%):  [100 %] 100 % Set Rate:  [10 bmp] 10 bmp PEEP:  [5 cmH20] 5 cmH20  INTAKE / OUTPUT: I/O last 3 completed shifts: In: 1836 [P.O.:1680; I.V.:6; IV Piggyback:150] Out: 400 [Urine:400]  PHYSICAL EXAMINATION: General:  Resting comfortably Neuro:  Awake, alert, oriented x4 HEENT:  NCAT, OP clear Cardiovascular:  Tachy, regular, no mgr Lungs:  Diminished R base, clear left, normal effort Abdomen:  BS+, soft, nontender Musculoskeletal:  Normal bulk and tone Skin:  Slight jaundice, spider angiomas throughout, bruising throughout  LABS:  BMET  Recent Labs Lab 02/11/15 1442 02/12/15 0225  NA 137 130*  K 3.5 3.5  CL 105 100*  CO2 23 18*  BUN 5* 8  CREATININE 1.05* 1.42*  GLUCOSE 129* 389*    Electrolytes  Recent Labs Lab 02/11/15 1442 02/11/15 1851 02/12/15 0225  CALCIUM 8.9  --  8.4*  MG  --  2.3  --   PHOS  --  3.4  --     CBC  Recent Labs Lab 02/11/15 1442 02/12/15 0225  WBC 7.1 7.9  HGB 11.4* 9.9*  HCT 32.4* 29.9*  PLT 112* 79*    Coag's  Recent Labs Lab 02/11/15 1442 02/11/15 2111  APTT 36  --   INR 1.74* 1.90*    Sepsis Markers  Recent Labs Lab 02/11/15 1851 02/11/15 2243 02/12/15 0225  LATICACIDVEN 6.9* 6.5*  5.4*    ABG No results for input(s): PHART, PCO2ART, PO2ART in the last 168 hours.  Liver Enzymes  Recent Labs Lab 02/11/15 1442 02/12/15 0225  AST 88* 80*  ALT 22 23  ALKPHOS 102 84  BILITOT 5.9* 4.9*  ALBUMIN 2.9* 2.5*    Cardiac Enzymes No results for input(s): TROPONINI, PROBNP in the last 168 hours.  Glucose No results for input(s): GLUCAP in the last 168 hours.  Imaging Dg Chest Port 1 View  02/11/2015  CLINICAL DATA:  Post right thoracentesis EXAM: PORTABLE CHEST 1 VIEW COMPARISON:  02/11/2015 FINDINGS: Large right pleural effusion, decreasing since prior study following thoracentesis. No pneumothorax. Improved aeration in the right upper lobe with continued probable atelectasis throughout much of the right lung. No focal opacity on the left. Heart is normal size. No acute bony abnormality. IMPRESSION: Large right pleural effusion, decreasing following thoracentesis with improved aeration in the right upper lobe. No pneumothorax. Electronically Signed   By: Charlett NoseKevin  Dover M.D.   On: 02/11/2015 18:31   Dg Chest Port 1 View  02/11/2015  CLINICAL DATA:  Shortness of breath for 3 days EXAM: PORTABLE CHEST 1 VIEW COMPARISON:  January 28, 2014 FINDINGS: There is diffuse opacification of the right hemithorax. The left lung is clear. Heart size is normal. Pulmonary vascularity on  the left is normal. Pulmonary vascularity on the right is obscured. There is no adenopathy in regions which can be assessed. IMPRESSION: Diffuse opacity of the right hemithorax. Suspect large pleural effusion with likely compressive atelectasis. Underlying right lung mass/and or consolidation cannot be excluded radiographically. Left lung clear. Cardiac silhouette within normal limits. Electronically Signed   By: Bretta Bang III M.D.   On: 02/11/2015 14:46     STUDIES:  CXR > large R pleural effusion 12/20215  - Left ventricle: The cavity size was normal. Wall thickness was normal. Systolic  function was normal. The estimated ejection fraction was in the range of 60% to 65%. Wall motion was normal; there were no regional wall motion abnormalities. Doppler parameters are consistent with abnormal left ventricular relaxation (grade 1 diastolic dysfunction). - Left atrium: The atrium was mildly dilated.  Impressions:  - Normal LV function; grade 1 diastolic dysfunction; mild LAE.  CULTURES: December 28 pleural fluid culture sent for bacterial, fungal, AFB  ANTIBIOTICS: None  SIGNIFICANT EVENTS:   LINES/TUBES:   DISCUSSION: 54 year old female with a cirrhosis who presented with a R pleural effusion which is a transudate.  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  Does not have CHF based on exam and echo from a year ago.  Infection or malignancy unlikely given transudative fluid analysis.  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 Smoker Ascites> says furosemide not making her urinate anymore; does not follow low sodium diet P:   Will check spot urine creatinine and protein ratio Paracentesis today F/u pleural fluid cytology Increase lasix to  bid + spironolactone daily Counseled to quit smoking Counseled to follow low sodium diet Will need f/u CXR with me or our NP in a follow up in 2 weeks > expect this effusion to improve with paracentesis and adjusted diuresis, but if doesn't completely resolve she will need another thoracentesis (offered today, she wants to avoid another one for now).  PCCM will sign off   Heber [place], MD [place] [facility] Pager: [phone] Cell: [phone] After 3pm or if no response, call [phone]  02/12/2015, 9:57 AM

## 2015-02-13 ENCOUNTER — Ambulatory Visit (HOSPITAL_COMMUNITY): Admission: RE | Admit: 2015-02-13 | Payer: Commercial Managed Care - HMO | Source: Ambulatory Visit

## 2015-02-13 DIAGNOSIS — J9601 Acute respiratory failure with hypoxia: Secondary | ICD-10-CM | POA: Diagnosis not present

## 2015-02-13 DIAGNOSIS — F329 Major depressive disorder, single episode, unspecified: Secondary | ICD-10-CM | POA: Diagnosis not present

## 2015-02-13 DIAGNOSIS — K7031 Alcoholic cirrhosis of liver with ascites: Secondary | ICD-10-CM | POA: Diagnosis not present

## 2015-02-13 DIAGNOSIS — J948 Other specified pleural conditions: Secondary | ICD-10-CM | POA: Diagnosis not present

## 2015-02-13 DIAGNOSIS — J441 Chronic obstructive pulmonary disease with (acute) exacerbation: Secondary | ICD-10-CM | POA: Diagnosis not present

## 2015-02-13 DIAGNOSIS — I5032 Chronic diastolic (congestive) heart failure: Secondary | ICD-10-CM | POA: Diagnosis not present

## 2015-02-13 DIAGNOSIS — M549 Dorsalgia, unspecified: Secondary | ICD-10-CM | POA: Diagnosis not present

## 2015-02-13 DIAGNOSIS — N179 Acute kidney failure, unspecified: Secondary | ICD-10-CM | POA: Diagnosis not present

## 2015-02-13 DIAGNOSIS — I110 Hypertensive heart disease with heart failure: Secondary | ICD-10-CM | POA: Diagnosis not present

## 2015-02-13 LAB — COMPREHENSIVE METABOLIC PANEL
ALK PHOS: 91 U/L (ref 38–126)
ALT: 22 U/L (ref 14–54)
ANION GAP: 9 (ref 5–15)
AST: 65 U/L — ABNORMAL HIGH (ref 15–41)
Albumin: 2.7 g/dL — ABNORMAL LOW (ref 3.5–5.0)
BUN: 18 mg/dL (ref 6–20)
CALCIUM: 8.8 mg/dL — AB (ref 8.9–10.3)
CO2: 22 mmol/L (ref 22–32)
CREATININE: 1.39 mg/dL — AB (ref 0.44–1.00)
Chloride: 100 mmol/L — ABNORMAL LOW (ref 101–111)
GFR, EST AFRICAN AMERICAN: 49 mL/min — AB (ref >60)
GFR, EST NON AFRICAN AMERICAN: 42 mL/min — AB (ref >60)
Glucose, Bld: 204 mg/dL — ABNORMAL HIGH (ref 65–99)
Potassium: 3.7 mmol/L (ref 3.5–5.1)
Sodium: 131 mmol/L — ABNORMAL LOW (ref 135–145)
TOTAL PROTEIN: 6.6 g/dL (ref 6.5–8.1)
Total Bilirubin: 3.8 mg/dL — ABNORMAL HIGH (ref 0.3–1.2)

## 2015-02-13 LAB — MAGNESIUM: MAGNESIUM: 2 mg/dL (ref 1.7–2.4)

## 2015-02-13 LAB — LACTIC ACID, PLASMA
Lactic Acid, Venous: 2.6 mmol/L (ref 0.5–2.0)
Lactic Acid, Venous: 2.3 mmol/L (ref 0.5–2.0)

## 2015-02-13 LAB — URINE CULTURE

## 2015-02-13 LAB — AMMONIA: AMMONIA: 91 umol/L — AB (ref 9–35)

## 2015-02-13 MED ORDER — BENZONATATE 100 MG PO CAPS
100.0000 mg | ORAL_CAPSULE | Freq: Two times a day (BID) | ORAL | Status: DC | PRN
  Administered 2015-02-13 – 2015-02-19 (×7): 100 mg via ORAL
  Filled 2015-02-13 (×7): qty 1

## 2015-02-13 MED ORDER — FUROSEMIDE 40 MG PO TABS
60.0000 mg | ORAL_TABLET | Freq: Two times a day (BID) | ORAL | Status: DC
  Administered 2015-02-13 – 2015-02-18 (×9): 60 mg via ORAL
  Filled 2015-02-13 (×9): qty 1

## 2015-02-13 NOTE — Progress Notes (Signed)
Melissa Stout TEAM 1 - Stepdown/ICU TEAM Progress Note  Melissa Stout ZOX:[id number] DOB: [birth year] DOA: 02/11/2015 PCP: Freddy Jaksch, MD  Admit HPI / Brief Narrative: 54 y.o. WF PMHx Depression, Chronic Back Pain, Prediabetes, COPD/Asthma, Liver Cirrhosis with Ascites,  Shortness of breath. Gradual onset. 4 days ago. Associated with wet cough. Albuterol without improvement. Wheezing. Denies fevers, chest pain, palpitations, dysuria, frequency, abdominal pain. Denies abdominal distention. Patient was evaluated in Dr. Hulen Shouts office 2 weeks ago for her hepatic cirrhosis. A small right pleural effusion was noted at that time and was scheduled for return evaluation on 02/13/2015.  HPI/Subjective: 12/30  A/O 4, NAD. States Negative SOB, negative abdominal pain. Believes her dry weight is 151 pounds (68.4 kg)   Assessment/Plan: Acute respiratory failure with Hypoxia/Pleural Effusion  -12/28 S/P Thorocentesis; 1.5 L removed   : On BiPAP. Likely secondary to massive right pleural effusion and COPD exacerbation. Effusion concerning for malignancy but remote chance its related to cirrhosis/chf/infection. -12/28 S/P Thorocentesis; 1.5 L removed  - Continue Lasix 60 mg  BID; discussed with [facility] M who will follow patient as outpatient. Patient still has some pleural effusion but not interested in repeat thoracentesis today  - Trend lactic acid; most likely secondary to large pleural effusion--> respiratory failure with hypoxia -Afebrile, negative leukocytosis, negative bands, negative last shift. Symptoms unlikely to be secondary to infection have stopped antibiotics  Chronic diastolic CHF/sinus tachycardia:  -Change Lasix to PO 60 mg BID - Metoprolol 25 mg  BID  -Strict I&O since admission +1 L -Daily weight admission weight= 72.5 kg               12/30 standing weight= 73.4 kg  Acute on chronic renal failure (baseline Cr~1.05-1.2) -Cr improving with diuresis   Hepatic Cirrhosis: MELD score  20. Followed by Dr Outlaw/Ascites -Spironolactone 100 mg daily -Lactulose 30 gm daily -Paracentesis attempted by IR however not sufficient fluid for procedure so aborted   GERD: - continue PPI  Anxiety: - continue xanax 0.5 mg PRN QHS   Code Status: FULL Family Communication: family present at time of exam Disposition Plan: DC in A.m.     Consultants: Dr.Douglas Leandrew [facility] M    Procedure/Significant Events: 12/28 S/P Thorocentesis; 1.5 L removed    Culture 12/28 Rt Pleural Fluid NGTD 12/29 urine pending   Antibiotics: Levofloxacin 12/29 1dose   DVT prophylaxis: SCD   Devices    LINES / TUBES:      Continuous Infusions:   Objective: VITAL SIGNS: Temp: 97.5 F (36.4 C) (12/30 1246) Temp Source: Oral (12/30 1246) BP: 101/61 mmHg (12/30 1246) Pulse Rate: 92 (12/30 1246) SPO2; FIO2:   Intake/Output Summary (Last 24 hours) at 02/13/15 1614 Last data filed at 02/13/15 1000  Gross per 24 hour  Intake    603 ml  Output    500 ml  Net    103 ml     Exam: General:  A/O 4, NAD.No acute respiratory distress Eyes: Negative headache, eye pain, double vision,negative scleral hemorrhage ENT: Negative Runny nose, negative ear pain, negative gingival bleeding, Neck:  Negative scars, masses, torticollis, lymphadenopathy, JVD Lungs: absent RLL breath sounds, remainder of lung clear to auscultation, negative wheezes or crackles Cardiovascular:  tachycardic, Regular rhythm without murmur gallop or rub normal S1 and S2 Abdomen:negative abdominal pain,  positive distention, negative tenderness,positive soft, bowel sounds, no rebound, negative  ascites, no appreciable mass Extremities: No significant cyanosis, clubbing, or edema bilateral lower extremities Psychiatric:  Negative depression, negative  anxiety, negative fatigue, negative mania  Neurologic:  Cranial nerves II through XII intact, tongue/uvula midline, all extremities muscle strength 5/5,  sensation intact throughout, negative dysarthria, negative expressive aphasia, negative receptive aphasia.   Data Reviewed: Basic Metabolic Panel:  Recent Labs Lab 02/11/15 1442 02/11/15 1851 02/12/15 0225 02/13/15 0915  NA 137  --  130* 131*  K 3.5  --  3.5 3.7  CL 105  --  100* 100*  CO2 23  --  18* 22  GLUCOSE 129*  --  389* 204*  BUN 5*  --  8 18  CREATININE 1.05*  --  1.42* 1.39*  CALCIUM 8.9  --  8.4* 8.8*  MG  --  2.3  --  2.0  PHOS  --  3.4  --   --    Liver Function Tests:  Recent Labs Lab 02/11/15 1442 02/11/15 1851 02/12/15 0225 02/13/15 0915  AST 88*  --  80* 65*  ALT 22  --  23 22  ALKPHOS 102  --  84 91  BILITOT 5.9*  --  4.9* 3.8*  PROT 7.8 7.7 6.8 6.6  ALBUMIN 2.9*  --  2.5* 2.7*   No results for input(s): LIPASE, AMYLASE in the last 168 hours.  Recent Labs Lab 02/13/15 0510  AMMONIA 91*   CBC:  Recent Labs Lab 02/11/15 1442 02/12/15 0225  WBC 7.1 7.9  NEUTROABS 4.8  --   HGB 11.4* 9.9*  HCT 32.4* 29.9*  MCV 98.8 100.7*  PLT 112* 79*   Cardiac Enzymes: No results for input(s): CKTOTAL, CKMB, CKMBINDEX, TROPONINI in the last 168 hours. BNP (last 3 results)  Recent Labs  02/11/15 1442  BNP 51.4    ProBNP (last 3 results) No results for input(s): PROBNP in the last 8760 hours.  CBG: No results for input(s): GLUCAP in the last 168 hours.  Recent Results (from the past 240 hour(s))  Body fluid culture     Status: None (Preliminary result)   Collection Time: 02/11/15  7:35 PM  Result Value Ref Range Status   Specimen Description PLEURAL RIGHT FLUID  Final   Special Requests NONE  Final   Gram Stain   Final    RARE WBC PRESENT,BOTH PMN AND MONONUCLEAR NO ORGANISMS SEEN    Culture NO GROWTH 2 DAYS  Final   Report Status PENDING  Incomplete  Urine culture     Status: None   Collection Time: 02/12/15 11:53 AM  Result Value Ref Range Status   Specimen Description URINE, RANDOM  Final   Special Requests NONE  Final    Culture MULTIPLE SPECIES PRESENT, SUGGEST RECOLLECTION  Final   Report Status 02/13/2015 FINAL  Final     Studies:  Recent x-ray studies have been reviewed in detail by the Attending Physician  Scheduled Meds:  Scheduled Meds: . aspirin EC  81 mg Oral QHS  . feeding supplement (ENSURE ENLIVE)  237 mL Oral Q24H  . furosemide  60 mg Intravenous BID  . guaiFENesin  1,200 mg Oral BID  . lactulose  30 g Oral Daily  . metoprolol tartrate  25 mg Oral BID  . pantoprazole  80 mg Oral QHS  . pneumococcal 23 valent vaccine  0.5 mL Intramuscular Tomorrow-1000  . sodium chloride  3 mL Intravenous Q12H  . sodium chloride  3 mL Intravenous Q12H  . spironolactone  100 mg Oral Daily    Time spent on care of this patient: 40 mins   [place] J ,  MD  [facility] Office  [phone] Pager - [phone]  On-Call/Text Page:      [email or web address]      password [email or web address]  If 7PM-7AM, please contact night-coverage [email or web address] Password [email or web address] 02/13/2015, 4:14 PM   LOS: 2 days   Care during the described time interval was provided by me .  I have reviewed this patient's available data, including medical history, events of note, physical examination, and all test results as part of my evaluation. I have personally reviewed and interpreted all radiology studies.   Carolyne Littles, MD [phone] Pager

## 2015-02-13 NOTE — Care Management Important Message (Signed)
Important Message  Patient Details  Name: Melissa Stout MRN: [id number] Date of Birth: 06/09/1960   Medicare Important Message Given:  Yes    Kimberlin Scheel P Chenita Ruda 02/13/2015, 2:01 PM

## 2015-02-13 NOTE — Evaluation (Signed)
Occupational Therapy Evaluation Patient Details Name: Melissa Stout A Langham MRN: [id number] DOB: 06/30/1960 Today's Date: 02/13/2015    History of Present Illness 54 yo female admitted with R pleural effusion   Clinical Impression   Patient evaluated by Occupational Therapy with no further acute OT needs identified. All education has been completed and the patient has no further questions. See below for any follow-up Occupational Therapy or equipment needs. OT to sign off. Thank you for referral.   Orthostatic BP taken and RN putting data computer. Pt without orthostatic BP. Pt with appropriate incr in BP with mobility.     Follow Up Recommendations  No OT follow up    Equipment Recommendations  None recommended by OT    Recommendations for Other Services       Precautions / Restrictions Precautions Precautions: Fall      Mobility Bed Mobility Overal bed mobility: Independent                Transfers Overall transfer level: Independent                    Balance                                            ADL Overall ADL's : At baseline;Independent                                       General ADL Comments: educated on Energy conservation and handout provided. Pt with questions about foods and salt. Pt advised on shopping and food choices that contain salt and avoid salting extra. Pt DOE 3 out 4 with RA 93%. Pt verbalized taking medication by drinking from the bottle and not using a measuring cup     Vision Vision Assessment?: No apparent visual deficits   Perception     Praxis      Pertinent Vitals/Pain Pain Assessment: No/denies pain     Hand Dominance Right   Extremity/Trunk Assessment Upper Extremity Assessment Upper Extremity Assessment: Overall WFL for tasks assessed   Lower Extremity Assessment Lower Extremity Assessment: Defer to PT evaluation   Cervical / Trunk Assessment Cervical / Trunk  Assessment: Normal   Communication Communication Communication: No difficulties   Cognition Arousal/Alertness: Awake/alert Behavior During Therapy: WFL for tasks assessed/performed Overall Cognitive Status: Within Functional Limits for tasks assessed                     General Comments       Exercises       Shoulder Instructions      Home Living Family/patient expects to be discharged to:: Private residence Living Arrangements: Parent;Non-relatives/Friends (mom and boyfriend) Available Help at Discharge: Family;Available 24 hours/day Type of Home: House Home Access: Stairs to enter Enter[employer] CorporationEntrance Stairs-Number of Steps: 4 Entrance Stairs-Rails: None Home Layout: One level     Bathroom Shower/Tub: Tub/shower unit Shower/tub characteristics: Engineer, building servicesCurtain Bathroom Toilet: Standard     Home Equipment: None          Prior Functioning/Environment Level of Independence: Independent             OT Diagnosis:     OT Problem List:     OT Treatment/Interventions:      OT Goals(Current goals can be found in  the care plan section)    OT Frequency:     Barriers to D/C:            Co-evaluation              End of Session Nurse Communication: Mobility status  Activity Tolerance: Patient tolerated treatment well Patient left: in bed;with call bell/phone within reach;with nursing/sitter in room   Time: 0927-0944 OT Time Calculation (min): 17 min Charges:  OT General Charges $OT Visit: 1 Procedure OT Evaluation $Initial OT Evaluation Tier I: 1 Procedure G-Codes:    Harolyn Rutherford 28-Feb-2015, 9:56 AM   Mateo Flow   OTR/L Pager: [phone] Office: [phone] .

## 2015-02-13 NOTE — Care Management Note (Signed)
Case Management Note  Patient Details  Name: Melissa Stout MRN: [id number] Date of Birth: 08/25/1960  Subjective/Objective:      Adm w pleural effusion              Action/Plan: lives at home, pcp dr outlaw   Expected Discharge Date:                  Expected Discharge Plan:  Home w Home Health Services  In-House Referral:     Discharge planning Services  CM Consult  Post Acute Care Choice:  Home Health Choice offered to:  Patient  DME Arranged:    DME Agency:     HH Arranged:  PT HH Agency:  [facility]  Status of Service:     Medicare Important Message Given:    Date Medicare IM Given:    Medicare IM give by:    Date Additional Medicare IM Given:    Additional Medicare Important Message give by:     If discussed at Long Length of Stay Meetings, dates discussed:    Additional Comments: spoke w pt. No hx of hhc. Went over agency list and no pref as long as in network w [employer]. Ref to [facility] w [facility]. Have left note for md for hhc orders and face to face. Pt hoping for dc home today.  Hanley Haysowell, Kritika Stukes T, RN 02/13/2015, 11:15 AM

## 2015-02-14 ENCOUNTER — Inpatient Hospital Stay (HOSPITAL_COMMUNITY): Payer: Commercial Managed Care - HMO

## 2015-02-14 DIAGNOSIS — J9601 Acute respiratory failure with hypoxia: Secondary | ICD-10-CM | POA: Diagnosis present

## 2015-02-14 DIAGNOSIS — J90 Pleural effusion, not elsewhere classified: Secondary | ICD-10-CM | POA: Diagnosis present

## 2015-02-14 LAB — BASIC METABOLIC PANEL
ANION GAP: 9 (ref 5–15)
BUN: 23 mg/dL — ABNORMAL HIGH (ref 6–20)
CHLORIDE: 99 mmol/L — AB (ref 101–111)
CO2: 26 mmol/L (ref 22–32)
CREATININE: 1.43 mg/dL — AB (ref 0.44–1.00)
Calcium: 8.8 mg/dL — ABNORMAL LOW (ref 8.9–10.3)
GFR calc non Af Amer: 41 mL/min — ABNORMAL LOW (ref >60)
GFR, EST AFRICAN AMERICAN: 47 mL/min — AB (ref >60)
Glucose, Bld: 150 mg/dL — ABNORMAL HIGH (ref 65–99)
Potassium: 3.7 mmol/L (ref 3.5–5.1)
SODIUM: 134 mmol/L — AB (ref 135–145)

## 2015-02-14 LAB — PH, BODY FLUID: pH, Body Fluid: 8.4

## 2015-02-14 LAB — LACTIC ACID, PLASMA: LACTIC ACID, VENOUS: 1.5 mmol/L (ref 0.5–2.0)

## 2015-02-14 MED ORDER — ALBUTEROL SULFATE (2.5 MG/3ML) 0.083% IN NEBU
2.5000 mg | INHALATION_SOLUTION | Freq: Four times a day (QID) | RESPIRATORY_TRACT | Status: DC | PRN
  Administered 2015-02-14 – 2015-02-15 (×2): 2.5 mg via RESPIRATORY_TRACT
  Filled 2015-02-14 (×2): qty 3

## 2015-02-14 MED ORDER — HYDROCOD POLST-CPM POLST ER 10-8 MG/5ML PO SUER
5.0000 mL | Freq: Once | ORAL | Status: AC
  Administered 2015-02-14: 5 mL via ORAL
  Filled 2015-02-14: qty 5

## 2015-02-14 MED ORDER — ALBUMIN HUMAN 25 % IV SOLN
50.0000 g | Freq: Once | INTRAVENOUS | Status: AC
  Administered 2015-02-15: 50 g via INTRAVENOUS
  Filled 2015-02-14: qty 200

## 2015-02-14 NOTE — Progress Notes (Signed)
Patient [facility], chocolate candy, pop tarts, and pizza. Education on cardiac diet on sodium count. Ask patient to relay to family not to bring outside food in to her at this time. Patient verbalized understanding.

## 2015-02-14 NOTE — Discharge Summary (Signed)
Physician Discharge Summary  Melissa Stout A Vessell XLK:[id number] DOB: 01/27/1961 DOA: 02/11/2015  PCP: Freddy JakschUTLAW,Melissa M, MD  Admit date: 02/11/2015 Discharge date: 02/14/2015  Time spent: 35 minutes  Recommendations for Outpatient Follow-up:  Acute respiratory failure with Hypoxia/Pleural Effusion  -12/28 S/P Thorocentesis; 1.5 L removed  : On BiPAP. Likely secondary to massive right pleural effusion and COPD exacerbation. Effusion concerning for malignancy but remote chance its related to cirrhosis/chf/infection. -12/28 S/P Thorocentesis; 1.5 L removed  - Continue Lasix 60 mg BID; discussed with PCCM who will follow patient as outpatient.  -12/31 Patient now having increased WOB/SOB interested in having effusion drained prior to discharge.  -1/1 S/P right side thoracentesis; 1.7 L removed. SOB resolved with thoracentesis, patient ambulated around ward without decrease in SPO2 -Upon discharge schedule follow-up with MelissaBrent Stout PCCM in 2 weeks -Schedule follow-up with Dr. Willis ModenaWilliam Outlaw PCP in one week, hepatic cirrhosis, acute on chronic renal failure, recurrent right sided pleural effusion ADDENDUM;  -Ambulatory SPO2 -Appears patient will need home O2 -Patient was to see PCCM MelissaBrent Stout in 2 weeks post discharge, recommend reConsulting in the [facility] Pleurx cath prior to discharge?  Chronic diastolic CHF/sinus tachycardia:  -Continue Lasix to PO 60 mg BID - Metoprolol 25 mg BID  -Strict I&O since admission + 2.1 L -Daily weight admission weight= 72.5 kg 1/1 standing weight= 74.2 kg -Patient counseled to weigh self daily, and maintain a logbook. -If she gains more than 3 pounds in 24 hours [facility] on increasing diuretic  Acute on chronic renal failure (baseline Cr~1.05-1.2) -Cr increased slightly increased with diuresis   Hepatic Cirrhosis: MELD score 20. Followed by Dr Outlaw/Ascites -Spironolactone 100 mg  daily -Lactulose 30 gm daily -Paracentesis attempted by IR however not sufficient fluid for procedure so aborted   GERD: - continue PPI  Anxiety: - continue xanax 0.5 mg PRN QHS    Discharge Diagnoses:  Active Problems:   Cirrhosis of liver with ascites (HCC)   Pleural effusion, right   Acute respiratory failure (HCC)   Chronic diastolic CHF (congestive heart failure) (HCC)   GERD (gastroesophageal reflux disease)   Anxiety   Pleural effusion   Ascites   S/P thoracentesis   Discharge Condition: Stable  Diet recommendation: Heart healthy  Filed Weights   02/11/15 1435 02/13/15 1724 02/14/15 0600  Weight: 72.576 kg (160 lb) 73.483 kg (162 lb) 75.7 kg (166 lb 14.2 oz)    History of present illness:  54 y.o. WF PMHx Depression, Chronic Back Pain, Prediabetes, COPD/Asthma, Liver Cirrhosis with Ascites,  Shortness of breath. Gradual onset. 4 days ago. Associated with wet cough. Albuterol without improvement. Wheezing. Denies fevers, chest pain, palpitations, dysuria, frequency, abdominal pain. Denies abdominal distention. Patient was evaluated in Melissa Stout's office 2 weeks ago for her hepatic cirrhosis. A small right pleural effusion was noted at that time and was scheduled for return evaluation on 02/13/2015. During his hospitalization patient was treated for acute respiratory failure with hypoxia secondary to large pleural effusion. Prior to discharge patient has received 2 thoracentesis, initial thoracentesis removed 1.5 L, second thoracentesis removed 1.7 L. Each fluid sample were sent for culture stain/cytology. In addition patient was treated for Chronic Diastolic CHF, as well as hepatic cirrhosis. NOTE; page to bedside just prior to patient departing, patient again having positive DOE, positive SOB. Obtained PCXR which showed residual right sided pleural effusion/pulmonary congestion but significantly improved.  Consultants: MelissaDouglas Leandrew Stout Stout [facility] PA Ralene MuskratPamela Stout,  IR  Procedure/Significant Events: 12/28 S/P Thorocentesis; 1.5 L removed  12/31 PCXR; large right pleural effusion 02/15/2015 S/P Thoracentesis; 1.7 L removed    Culture 12/28 Rt Pleural Fluid NGTD 12/29 urine negative final   Antibiotics: Levofloxacin 12/29  1dose    Discharge Exam: Filed Vitals:   02/14/15 0610 02/14/15 0759 02/14/15 0827 02/14/15 1157  BP:  102/61  104/49  Pulse:  101  87  Temp:  98 F (36.7 C)  98 F (36.7 C)  TempSrc:  Oral  Oral  Resp:  19  15  Height:      Weight:      SpO2: 94% 89% 91% 100%    General: A/O 4, NAD.No acute respiratory distress Eyes: Negative headache, eye pain, double vision,negative scleral hemorrhage ENT: Negative Runny nose, negative ear pain, negative gingival bleeding, Neck: Negative scars, masses, torticollis, lymphadenopathy, JVD Lungs: clear to auscultation bilateral negative expiratory wheezes, negative crackles Cardiovascular: Regular rhythm and rate without murmur gallop or rub normal S1 and S2 Abdomen:negative abdominal pain, positive distention, negative tenderness,positive soft, bowel sounds, no rebound, negative ascites, no appreciable mass  Discharge Instructions     Medication List    ASK your doctor about these medications        ALPRAZolam 0.5 MG tablet  Commonly known as:  XANAX  Take 0.5 mg by mouth 2 (two) times daily as needed for anxiety.     amoxicillin-clavulanate 875-125 MG tablet  Commonly known as:  AUGMENTIN  Take 1 tablet by mouth 2 (two) times daily.     aspirin EC 81 MG tablet  Take 81 mg by mouth at bedtime.     furosemide 40 MG tablet  Commonly known as:  LASIX  Take 1 tablet (40 mg total) by mouth 2 (two) times daily.     lactulose 10 GM/15ML solution  Commonly known as:  CHRONULAC  Take 45 mLs (30 g total) by mouth 2 (two) times daily. Please titrate dose to have 2-3 Bowel Movement a day     metoprolol succinate 25 MG 24 hr tablet  Commonly known as:  TOPROL-XL   Take 25 mg by mouth daily.     omeprazole 40 MG capsule  Commonly known as:  PRILOSEC  Take 40 mg by mouth daily.     Oxycodone HCl 10 MG Tabs  Take 10 mg by mouth every 6 (six) hours as needed (pain).     PROAIR HFA 108 (90 Base) MCG/ACT inhaler  Generic drug:  albuterol  Inhale 2 puffs into the lungs every 6 (six) hours as needed for wheezing or shortness of breath.     rifaximin 550 MG Tabs tablet  Commonly known as:  XIFAXAN  Take 1 tablet (550 mg total) by mouth 2 (two) times daily.     spironolactone 100 MG tablet  Commonly known as:  ALDACTONE  Take 100 mg by mouth daily.       Allergies  Allergen Reactions  . Zestril [Lisinopril] Cough      The results of significant diagnostics from this hospitalization (including imaging, microbiology, ancillary and laboratory) are listed below for reference.    Significant Diagnostic Studies: US Abdomen Limited  02/12/2015  CLINICAL DATA:  Abdominal distention EXAM: LIMITED ABDOMEN ULTRASOUND FOR ASCITES TECHNIQUE: Limited ultrasound survey for ascites was performed in all four abdominal quadrants. COMPARISON:  February 02, 2015 FINDINGS: There is peristalsing bowel throughout the abdomen pelvis. There is no appreciable ascites. IMPRESSION: No appreciable ascites. Electronically Signed   By: Chrissie Stout  Melissa Stout Stout.D.   On: 02/12/2015 14:42   US Abdomen Limited  02/02/2015  CLINICAL DATA:  Cirrhosis.  Evaluate for ascites. EXAM: LIMITED ABDOMEN ULTRASOUND FOR ASCITES TECHNIQUE: Limited ultrasound survey for ascites was performed in all four abdominal quadrants. COMPARISON:  08/20/2014 abdominal sonogram. FINDINGS: There is trace perihepatic ascites anterior to the liver on the right upper quadrant image. No ascites is seen in the left upper quadrant or either lower quadrant. IMPRESSION: Trace perihepatic ascites. No significant drainable pocket of ascites. Electronically Signed   By: Delbert Stout Stout.D.   On: 02/02/2015 12:40   Dg  Chest Port 1 View  02/11/2015  CLINICAL DATA:  Post right thoracentesis EXAM: PORTABLE CHEST 1 VIEW COMPARISON:  02/11/2015 FINDINGS: Large right pleural effusion, decreasing since prior study following thoracentesis. No pneumothorax. Improved aeration in the right upper lobe with continued probable atelectasis throughout much of the right lung. No focal opacity on the left. Heart is normal size. No acute bony abnormality. IMPRESSION: Large right pleural effusion, decreasing following thoracentesis with improved aeration in the right upper lobe. No pneumothorax. Electronically Signed   By: Charlett Nose Stout.D.   On: 02/11/2015 18:31   Dg Chest Port 1 View  02/11/2015  CLINICAL DATA:  Shortness of breath for 3 days EXAM: PORTABLE CHEST 1 VIEW COMPARISON:  January 28, 2014 FINDINGS: There is diffuse opacification of the right hemithorax. The left lung is clear. Heart size is normal. Pulmonary vascularity on the left is normal. Pulmonary vascularity on the right is obscured. There is no adenopathy in regions which can be assessed. IMPRESSION: Diffuse opacity of the right hemithorax. Suspect large pleural effusion with likely compressive atelectasis. Underlying right lung mass/and or consolidation cannot be excluded radiographically. Left lung clear. Cardiac silhouette within normal limits. Electronically Signed   By: Bretta Bang Stout Stout.D.   On: 02/11/2015 14:46    Microbiology: Recent Results (from the past 240 hour(s))  Body fluid culture     Status: None (Preliminary result)   Collection Time: 02/11/15  7:35 PM  Result Value Ref Range Status   Specimen Description PLEURAL RIGHT FLUID  Final   Special Requests NONE  Final   Gram Stain   Final    RARE WBC PRESENT,BOTH PMN AND MONONUCLEAR NO ORGANISMS SEEN    Culture NO GROWTH 3 DAYS  Final   Report Status PENDING  Incomplete  Urine culture     Status: None   Collection Time: 02/12/15 11:53 AM  Result Value Ref Range Status   Specimen  Description URINE, RANDOM  Final   Special Requests NONE  Final   Culture MULTIPLE SPECIES PRESENT, SUGGEST RECOLLECTION  Final   Report Status 02/13/2015 FINAL  Final     Labs: Basic Metabolic Panel:  Recent Labs Lab 02/11/15 1442 02/11/15 1851 02/12/15 0225 02/13/15 0915 02/14/15 0900  NA 137  --  130* 131* 134*  K 3.5  --  3.5 3.7 3.7  CL 105  --  100* 100* 99*  CO2 23  --  18* 22 26  GLUCOSE 129*  --  389* 204* 150*  BUN 5*  --  8 18 23*  CREATININE 1.05*  --  1.42* 1.39* 1.43*  CALCIUM 8.9  --  8.4* 8.8* 8.8*  MG  --  2.3  --  2.0  --   PHOS  --  3.4  --   --   --    Liver Function Tests:  Recent Labs Lab 02/11/15 1442 02/11/15  1851 02/12/15 0225 02/13/15 0915  AST 88*  --  80* 65*  ALT 22  --  23 22  ALKPHOS 102  --  84 91  BILITOT 5.9*  --  4.9* 3.8*  PROT 7.8 7.7 6.8 6.6  ALBUMIN 2.9*  --  2.5* 2.7*   No results for input(s): LIPASE, AMYLASE in the last 168 hours.  Recent Labs Lab 02/13/15 0510  AMMONIA 91*   CBC:  Recent Labs Lab 02/11/15 1442 02/12/15 0225  WBC 7.1 7.9  NEUTROABS 4.8  --   HGB 11.4* 9.9*  HCT 32.4* 29.9*  MCV 98.8 100.7*  PLT 112* 79*   Cardiac Enzymes: No results for input(s): CKTOTAL, CKMB, CKMBINDEX, TROPONINI in the last 168 hours. BNP: BNP (last 3 results)  Recent Labs  02/11/15 1442  BNP 51.4    ProBNP (last 3 results) No results for input(s): PROBNP in the last 8760 hours.  CBG: No results for input(s): GLUCAP in the last 168 hours.     Signed:  Carolyne Littles, MD [facility] [phone] pager

## 2015-02-14 NOTE — Progress Notes (Signed)
[facility] TEAM 1 - Stepdown/ICU TEAM Progress Note  Ala BentLou A Korff ZOX:[id number] DOB: 09/29/1960 DOA: 02/11/2015 PCP: Freddy JakschUTLAW,WILLIAM M, MD  Admit HPI / Brief Narrative: 54 y.o. WF PMHx Depression, Chronic Back Pain, Prediabetes, COPD/Asthma, Liver Cirrhosis with Ascites,  Shortness of breath. Gradual onset. 4 days ago. Associated with wet cough. Albuterol without improvement. Wheezing. Denies fevers, chest pain, palpitations, dysuria, frequency, abdominal pain. Denies abdominal distention. Patient was evaluated in Dr. Hulen Shoutsoutlaw's office 2 weeks ago for her hepatic cirrhosis. A small right pleural effusion was noted at that time and was scheduled for return evaluation on 02/13/2015.  HPI/Subjective: 12/31  A/O 4, states increase WOB/SOB, negative CP, negative abdominal pain. Believes her dry weight is 151 pounds (68.4 kg)   Assessment/Plan: Acute respiratory failure with Hypoxia/Pleural Effusion  -12/28 S/P Thorocentesis; 1.5 L removed   : On BiPAP. Likely secondary to massive right pleural effusion and COPD exacerbation. Effusion concerning for malignancy but remote chance its related to cirrhosis/chf/infection. -12/28 S/P Thorocentesis; 1.5 L removed  - Continue Lasix 60 mg  BID; discussed with PCCM who will follow patient as outpatient.  -12/31 Patient now having increased WOB/SOB interested in having effusion drained prior to discharge.  -Obtain PCXR evaluate interval change effusion  -12/31 PCXR; large right pleural effusion bedside/IR paracentesis performed on 02/15/2015 -50 gm albumin x 1 in the AM -Upon discharge will follow-up with PCCM  Chronic diastolic CHF/sinus tachycardia:  -Continue Lasix to PO 60 mg BID - Metoprolol 25 mg  BID  -Strict I&O since admission +1.3 L -Daily weight admission weight= 72.5 kg               12/31 standing weight= 75.7 kg  Acute on chronic renal failure (baseline Cr~1.05-1.2) -Cr increased slightly with diuresis   Hepatic Cirrhosis: MELD score  20. Followed by Dr Outlaw/Ascites -Spironolactone 100 mg daily -Lactulose 30 gm daily -Paracentesis attempted by IR however not sufficient fluid for procedure so aborted   GERD: - continue PPI  Anxiety: - continue xanax 0.5 mg PRN QHS   Code Status: FULL Family Communication: family present at time of exam Disposition Plan: DC in A.m.     Consultants: Dr.Douglas Leandrew [facility]agiB Mc[facility]    Procedure/Significant Events: 12/28 S/P Thorocentesis; 1.5 L removed  12/31 PCXR; large right oral effusion   Culture 12/28 Rt Pleural Fluid NGTD 12/29 urine pending   Antibiotics: Levofloxacin 12/29 1dose   DVT prophylaxis: SCD   Devices    LINES / TUBES:      Continuous Infusions:   Objective: VITAL SIGNS: Temp: 98 F (36.7 C) (12/31 2000) Temp Source: Oral (12/31 2000) BP: 101/60 mmHg (12/31 2000) Pulse Rate: 97 (12/31 2000) SPO2; FIO2:   Intake/Output Summary (Last 24 hours) at 02/14/15 2008 Last data filed at 02/14/15 2000  Gross per 24 hour  Intake    776 ml  Output    350 ml  Net    426 ml     Exam: General:  A/O 4, NAD.No acute respiratory distress Eyes: Negative headache, eye pain, double vision,negative scleral hemorrhage ENT: Negative Runny nose, negative ear pain, negative gingival bleeding, Neck:  Negative scars, masses, torticollis, lymphadenopathy, JVD Lungs: absent RLL breath sounds, remainder of lung clear to auscultation, positive diffuse expiratory wheezes, negative crackles Cardiovascular:  Regular rhythm and rate without murmur gallop or rub normal S1 and S2 Abdomen:negative abdominal pain,  positive distention, negative tenderness,positive soft, bowel sounds, no rebound, negative  ascites, no appreciable mass Extremities: No significant cyanosis,  clubbing, or edema bilateral lower extremities Psychiatric:  Negative depression, negative anxiety, negative fatigue, negative mania  Neurologic:  Cranial nerves II through XII intact,  tongue/uvula midline, all extremities muscle strength 5/5, sensation intact throughout, negative dysarthria, negative expressive aphasia, negative receptive aphasia.   Data Reviewed: Basic Metabolic Panel:  Recent Labs Lab 02/11/15 1442 02/11/15 1851 02/12/15 0225 02/13/15 0915 02/14/15 0900  NA 137  --  130* 131* 134*  K 3.5  --  3.5 3.7 3.7  CL 105  --  100* 100* 99*  CO2 23  --  18* 22 26  GLUCOSE 129*  --  389* 204* 150*  BUN 5*  --  8 18 23*  CREATININE 1.05*  --  1.42* 1.39* 1.43*  CALCIUM 8.9  --  8.4* 8.8* 8.8*  MG  --  2.3  --  2.0  --   PHOS  --  3.4  --   --   --    Liver Function Tests:  Recent Labs Lab 02/11/15 1442 02/11/15 1851 02/12/15 0225 02/13/15 0915  AST 88*  --  80* 65*  ALT 22  --  23 22  ALKPHOS 102  --  84 91  BILITOT 5.9*  --  4.9* 3.8*  PROT 7.8 7.7 6.8 6.6  ALBUMIN 2.9*  --  2.5* 2.7*   No results for input(s): LIPASE, AMYLASE in the last 168 hours.  Recent Labs Lab 02/13/15 0510  AMMONIA 91*   CBC:  Recent Labs Lab 02/11/15 1442 02/12/15 0225  WBC 7.1 7.9  NEUTROABS 4.8  --   HGB 11.4* 9.9*  HCT 32.4* 29.9*  MCV 98.8 100.7*  PLT 112* 79*   Cardiac Enzymes: No results for input(s): CKTOTAL, CKMB, CKMBINDEX, TROPONINI in the last 168 hours. BNP (last 3 results)  Recent Labs  02/11/15 1442  BNP 51.4    ProBNP (last 3 results) No results for input(s): PROBNP in the last 8760 hours.  CBG: No results for input(s): GLUCAP in the last 168 hours.  Recent Results (from the past 240 hour(s))  Body fluid culture     Status: None (Preliminary result)   Collection Time: 02/11/15  7:35 PM  Result Value Ref Range Status   Specimen Description PLEURAL RIGHT FLUID  Final   Special Requests NONE  Final   Gram Stain   Final    RARE WBC PRESENT,BOTH PMN AND MONONUCLEAR NO ORGANISMS SEEN    Culture NO GROWTH 3 DAYS  Final   Report Status PENDING  Incomplete  Urine culture     Status: None   Collection Time: 02/12/15 11:53  AM  Result Value Ref Range Status   Specimen Description URINE, RANDOM  Final   Special Requests NONE  Final   Culture MULTIPLE SPECIES PRESENT, SUGGEST RECOLLECTION  Final   Report Status 02/13/2015 FINAL  Final     Studies:  Recent x-ray studies have been reviewed in detail by the Attending Physician  Scheduled Meds:  Scheduled Meds: . [START ON 02/15/2015] albumin human  50 g Intravenous Once  . aspirin EC  81 mg Oral QHS  . feeding supplement (ENSURE ENLIVE)  237 mL Oral Q24H  . furosemide  60 mg Oral BID  . guaiFENesin  1,200 mg Oral BID  . lactulose  30 g Oral Daily  . metoprolol tartrate  25 mg Oral BID  . pantoprazole  80 mg Oral QHS  . sodium chloride  3 mL Intravenous Q12H  . sodium chloride  3 mL  Intravenous Q12H  . spironolactone  100 mg Oral Daily    Time spent on care of this patient: 40 mins   WOODS, Roselind Messier , MD  [facility] Office  [phone] Pager - [phone]  On-Call/Text Page:      [email or web address]      password [email or web address]  If 7PM-7AM, please contact night-coverage [email or web address] Password [email or web address] 02/14/2015, 8:08 PM   LOS: 3 days   Care during the described time interval was provided by me .  I have reviewed this patient's available data, including medical history, events of note, physical examination, and all test results as part of my evaluation. I have personally reviewed and interpreted all radiology studies.   Carolyne Littles, MD [phone] Pager

## 2015-02-15 ENCOUNTER — Inpatient Hospital Stay (HOSPITAL_COMMUNITY): Payer: Commercial Managed Care - HMO

## 2015-02-15 LAB — LACTATE DEHYDROGENASE, PLEURAL OR PERITONEAL FLUID: LD FL: 40 U/L — AB (ref 3–23)

## 2015-02-15 LAB — GLUCOSE, SEROUS FLUID: GLUCOSE FL: 142 mg/dL

## 2015-02-15 LAB — GRAM STAIN

## 2015-02-15 LAB — BODY FLUID CELL COUNT WITH DIFFERENTIAL
EOS FL: 0 %
LYMPHS FL: 11 %
Monocyte-Macrophage-Serous Fluid: 53 % (ref 50–90)
NEUTROPHIL FLUID: 36 % — AB (ref 0–25)
WBC FLUID: 84 uL (ref 0–1000)

## 2015-02-15 LAB — BODY FLUID CULTURE: Culture: NO GROWTH

## 2015-02-15 LAB — PROTEIN, BODY FLUID: Total protein, fluid: <3 g/dL

## 2015-02-15 MED ORDER — FUROSEMIDE 20 MG PO TABS: 60.0000 mg | ORAL_TABLET | Freq: Two times a day (BID) | ORAL | Status: DC

## 2015-02-15 MED ORDER — ATROPINE SULFATE 0.1 MG/ML IJ SOLN
INTRAMUSCULAR | Status: AC
  Filled 2015-02-15: qty 10

## 2015-02-15 MED ORDER — LACTULOSE 10 GM/15ML PO SOLN: 30.0000 g | Freq: Every day | ORAL | Status: DC

## 2015-02-15 MED ORDER — METOPROLOL TARTRATE 25 MG PO TABS: 25.0000 mg | ORAL_TABLET | Freq: Two times a day (BID) | ORAL | Status: AC

## 2015-02-15 MED ORDER — LIDOCAINE HCL (PF) 1 % IJ SOLN
INTRAMUSCULAR | Status: AC
  Filled 2015-02-15: qty 10

## 2015-02-15 NOTE — Procedures (Signed)
  US guided Rt throacentesis  1.7 liters removed Yellow fluid Sent for labs per MD  Developed worsening cough procedure stopped  cxr pending BP stable

## 2015-02-16 MED ORDER — ALBUTEROL SULFATE (2.5 MG/3ML) 0.083% IN NEBU
2.5000 mg | INHALATION_SOLUTION | RESPIRATORY_TRACT | Status: DC | PRN
  Administered 2015-02-16 – 2015-02-19 (×4): 2.5 mg via RESPIRATORY_TRACT
  Filled 2015-02-16 (×4): qty 3

## 2015-02-16 MED ORDER — GUAIFENESIN ER 600 MG PO TB12
1200.0000 mg | ORAL_TABLET | Freq: Two times a day (BID) | ORAL | Status: DC | PRN
  Administered 2015-02-17 – 2015-02-19 (×4): 1200 mg via ORAL
  Filled 2015-02-16 (×4): qty 2

## 2015-02-16 MED ORDER — ACETAMINOPHEN 500 MG PO TABS: 500.0000 mg | ORAL_TABLET | Freq: Four times a day (QID) | ORAL | Status: DC | PRN

## 2015-02-16 MED ORDER — RIFAXIMIN 550 MG PO TABS
550.0000 mg | ORAL_TABLET | Freq: Two times a day (BID) | ORAL | Status: DC
  Administered 2015-02-16 – 2015-02-19 (×7): 550 mg via ORAL
  Filled 2015-02-16 (×9): qty 1

## 2015-02-16 MED ORDER — ACETAMINOPHEN 650 MG RE SUPP: 325.0000 mg | Freq: Four times a day (QID) | RECTAL | Status: DC | PRN

## 2015-02-16 MED ORDER — LACTULOSE 10 GM/15ML PO SOLN
30.0000 g | Freq: Two times a day (BID) | ORAL | Status: DC
  Administered 2015-02-16 – 2015-02-19 (×6): 30 g via ORAL
  Filled 2015-02-16 (×7): qty 45

## 2015-02-16 NOTE — Progress Notes (Signed)
[facility] TEAM 1 - Stepdown/ICU TEAM PROGRESS NOTE  GEISHA ABERNATHY ZOX:[id number] DOB: 03-24-1960 DOA: 02/11/2015 PCP: Freddy Jaksch, MD  Admit HPI / Brief Narrative: 55 yo F Hx Depression, Chronic Back Pain, Prediabetes, COPD/Asthma, and Liver Cirrhosis with Ascites who presented w/ a 4 day hx of gradually worsening SOB associated with wet cough. Patient was evaluated in Dr. Hulen Shouts office 2 weeks prior for her hepatic cirrhosis. A small right pleural effusion was noted at that time and she was scheduled for return evaluation on 02/13/2015.  HPI/Subjective: The patient is sleeping comfortably at the time of visit.  She easily awakens. She currently denies chest pain fevers chills nausea vomiting or abdominal pain.  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7kg - cont scheduled diuretics   Acute on chronic renal failure (baseline Cr~1.05-1.2) -Cr increased slightly with diuresis - recheck in AM  GERD -continue PPI  Anxiety -continue xanax 0.5 mg PRN QHS  Code Status: FULL Family Communication: no family present at time of exam Disposition Plan: SDU  Consultants: PCCM IR  Antibiotics: None   DVT prophylaxis: SCDs  Objective: Blood pressure 98/53, pulse 88, temperature 97.5 F (36.4 C), temperature source Oral, resp. rate 19,  height 5\' 6"  (1.676 m), weight 70.716 kg (155 lb 14.4 oz), SpO2 94 %.  Intake/Output Summary (Last 24 hours) at 02/16/15 1421 Last data filed at 02/15/15 2100  Gross per 24 hour  Intake    240 ml  Output    300 ml  Net    -60 ml   Exam: General: No acute respiratory distress at rest in bed  Lungs: poor air movement in R fields - no wheeze - no focal crackles  Cardiovascular: Regular rate and rhythm without murmur gallop or rub normal S1 and S2 Abdomen: Nontender, soft, bowel sounds positive, no rebound, no ascites, no appreciable mass Extremities: No significant cyanosis, clubbing, or edema bilateral lower extremities  Data Reviewed:  Basic Metabolic Panel:  Recent Labs Lab 02/11/15 1442 02/11/15 1851 02/12/15 0225 02/13/15 0915 02/14/15 0900  NA 137  --  130* 131* 134*  K 3.5  --  3.5 3.7 3.7  CL 105  --  100* 100* 99*  CO2 23  --  18* 22 26  GLUCOSE 129*  --  389* 204* 150*  BUN 5*  --  8 18 23*  CREATININE 1.05*  --  1.42* 1.39* 1.43*  CALCIUM 8.9  --  8.4* 8.8* 8.8*  MG  --  2.3  --  2.0  --   PHOS  --  3.4  --   --   --     CBC:  Recent Labs Lab 02/11/15 1442 02/12/15 0225  WBC 7.1 7.9  NEUTROABS 4.8  --   HGB 11.4* 9.9*  HCT 32.4* 29.9*  MCV 98.8 100.7*  PLT 112* 79*  Liver Function Tests:  Recent Labs Lab 02/11/15 1442 02/11/15 1851 02/12/15 0225 02/13/15 0915  AST 88*  --  80* 65*  ALT 22  --  23 22  ALKPHOS 102  --  84 91  BILITOT 5.9*  --  4.9* 3.8*  PROT 7.8 7.7 6.8 6.6  ALBUMIN 2.9*  --  2.5* 2.7*    Recent Labs Lab 02/13/15 0510  AMMONIA 91*    Coags:  Recent Labs Lab 02/11/15 1442 02/11/15 2111  INR 1.74* 1.90*    Recent Labs Lab 02/11/15 1442  APTT 36    Recent Results (from the past 240 hour(s))  Body fluid culture     Status: None   Collection Time: 02/11/15  7:35 PM  Result Value Ref Range Status   Specimen Description PLEURAL RIGHT FLUID  Final   Special Requests NONE  Final   Gram Stain   Final     RARE WBC PRESENT,BOTH PMN AND MONONUCLEAR NO ORGANISMS SEEN    Culture NO GROWTH 3 DAYS  Final   Report Status 02/15/2015 FINAL  Final  Urine culture     Status: None   Collection Time: 02/12/15 11:53 AM  Result Value Ref Range Status   Specimen Description URINE, RANDOM  Final   Special Requests NONE  Final   Culture MULTIPLE SPECIES PRESENT, SUGGEST RECOLLECTION  Final   Report Status 02/13/2015 FINAL  Final  Culture, body fluid-bottle     Status: None (Preliminary result)   Collection Time: 02/15/15 10:06 AM  Result Value Ref Range Status   Specimen Description FLUID PLEURAL RIGHT  Final   Special Requests NONE  Final   Culture NO GROWTH < 24 HOURS  Final   Report Status PENDING  Incomplete  Gram stain     Status: None   Collection Time: 02/15/15 10:06 AM  Result Value Ref Range Status   Specimen Description FLUID PLEURAL RIGHT  Final   Special Requests NONE  Final   Gram Stain   Final    RARE WBC PRESENT, PREDOMINANTLY MONONUCLEAR NO ORGANISMS SEEN    Report Status 02/15/2015 FINAL  Final     Studies:   Recent x-ray studies have been reviewed in detail by the Attending Physician  Scheduled Meds:  Scheduled Meds: . aspirin EC  81 mg Oral QHS  . feeding supplement (ENSURE ENLIVE)  237 mL Oral Q24H  . furosemide  60 mg Oral BID  . guaiFENesin  1,200 mg Oral BID  . lactulose  30 g Oral Daily  . metoprolol tartrate  25 mg Oral BID  . pantoprazole  80 mg Oral QHS  . sodium chloride  3 mL Intravenous Q12H  . sodium chloride  3 mL Intravenous Q12H  . spironolactone  100 mg Oral Daily    Time spent on care of this patient: 35 mins   MCCLUNG,JEFFREY T , MD   [facility] Office  [phone] Pager - Text Page per Loretha StaplerAmion as per below:  On-Call/Text Page:      [email or web address]      password [email or web address]  If 7PM-7AM, please contact night-coverage [email or web address] Password [email or web address] 02/16/2015, 2:21 PM   LOS: 5 days

## 2015-02-16 NOTE — Progress Notes (Signed)
Physical Therapy Treatment Patient Details Name: Melissa Stout MRN: [id number] DOB: 08/10/1960 Today's Date: 02/16/2015    History of Present Illness 55 yo female admitted with R pleural effusion    PT Comments    Progressing well with mobility.  SpO2 maintained in 91/92% range at rest, 80's bpm, with ambulation SpO2 dropped to 89/91% in the 90's.  Worked with pt on efficient breathing techniques.  Helped her know when to rest or just concentrate on efficient breathing.  Follow Up Recommendations  Home health PT;Supervision/Assistance - 24 hour     Equipment Recommendations  Other (comment)    Recommendations for Other Services       Precautions / Restrictions Precautions Precautions: Fall Restrictions Weight Bearing Restrictions: No    Mobility  Bed Mobility Overal bed mobility: Independent                Transfers Overall transfer level: Independent                  Ambulation/Gait Ambulation/Gait assistance: Supervision Ambulation Distance (Feet): 350 Feet (with about 5 standing rests) Assistive device: None     Gait velocity interpretation: at or above normal speed for age/gender General Gait Details: No LOB, generally steady   Stairs Stairs: Yes   Stair Management: One rail Right;Alternating pattern;Forwards Number of Stairs: 5 General stair comments: steady and safe with the rail  Wheelchair Mobility    Modified Rankin (Stroke Patients Only)       Balance Overall balance assessment: No apparent balance deficits (not formally assessed)           Standing balance-Leahy Scale: Good                      Cognition Arousal/Alertness: Awake/alert Behavior During Therapy: WFL for tasks assessed/performed Overall Cognitive Status: Within Functional Limits for tasks assessed                      Exercises      General Comments General comments (skin integrity, edema, etc.): SpO2 on RA at rest after sleep  89/91%  and HR 92 bpm;  After ambulating and completing stairs, sats dropped to 86 % at 95 bpm with instant rise to 92% at 94 bpm.  More ambulation showed 92%, 89-92% and 91% respectively, in the 90's bpm.      Pertinent Vitals/Pain Pain Assessment: No/denies pain    Home Living Family/patient expects to be discharged to:: Private residence Living Arrangements: Parent;Non-relatives/Friends Available Help at Discharge: Family;Available 24 hours/day                Prior Function            PT Goals (current goals can now be found in the care plan section) Acute Rehab PT Goals Patient Stated Goal: to gohome PT Goal Formulation: With patient Time For Goal Achievement: 02/19/15 Potential to Achieve Goals: Good Progress towards PT goals: Progressing toward goals    Frequency  Min 3X/week    PT Plan Current plan remains appropriate    Co-evaluation             End of Session   Activity Tolerance: Patient tolerated treatment well Patient left: in bed;with call bell/phone within reach;with family/visitor present     Time: [id number] PT Time Calculation (min) (ACUTE ONLY): 31 min  Charges:  $Gait Training: 8-22 mins $Therapeutic Activity: 8-22 mins  G Codes:      Aletha Allebach, Eliseo Gum 02/16/2015, 11:58 AM 02/16/2015  Dawson Bing, PT [phone]  (pager)

## 2015-02-17 ENCOUNTER — Telehealth: Payer: Self-pay

## 2015-02-17 ENCOUNTER — Inpatient Hospital Stay (HOSPITAL_COMMUNITY): Payer: Commercial Managed Care - HMO

## 2015-02-17 DIAGNOSIS — R0600 Dyspnea, unspecified: Secondary | ICD-10-CM | POA: Diagnosis present

## 2015-02-17 DIAGNOSIS — J90 Pleural effusion, not elsewhere classified: Secondary | ICD-10-CM | POA: Diagnosis present

## 2015-02-17 DIAGNOSIS — K7031 Alcoholic cirrhosis of liver with ascites: Principal | ICD-10-CM | POA: Diagnosis present

## 2015-02-17 DIAGNOSIS — N179 Acute kidney failure, unspecified: Secondary | ICD-10-CM

## 2015-02-17 DIAGNOSIS — E876 Hypokalemia: Secondary | ICD-10-CM

## 2015-02-17 DIAGNOSIS — N189 Chronic kidney disease, unspecified: Secondary | ICD-10-CM

## 2015-02-17 LAB — CBC
HCT: 31.5 % — ABNORMAL LOW (ref 36.0–46.0)
Hemoglobin: 10.6 g/dL — ABNORMAL LOW (ref 12.0–15.0)
MCH: 33.1 pg (ref 26.0–34.0)
MCHC: 33.7 g/dL (ref 30.0–36.0)
MCV: 98.4 fL (ref 78.0–100.0)
PLATELETS: 77 10*3/uL — AB (ref 150–400)
RBC: 3.2 MIL/uL — AB (ref 3.87–5.11)
RDW: 15.1 % (ref 11.5–15.5)
WBC: 6.3 10*3/uL (ref 4.0–10.5)

## 2015-02-17 LAB — PH, BODY FLUID: pH, Body Fluid: 7.6

## 2015-02-17 LAB — COMPREHENSIVE METABOLIC PANEL
ALK PHOS: 88 U/L (ref 38–126)
ALT: 20 U/L (ref 14–54)
AST: 50 U/L — AB (ref 15–41)
Albumin: 2.5 g/dL — ABNORMAL LOW (ref 3.5–5.0)
Anion gap: 10 (ref 5–15)
BUN: 10 mg/dL (ref 6–20)
CALCIUM: 8.6 mg/dL — AB (ref 8.9–10.3)
CO2: 28 mmol/L (ref 22–32)
CREATININE: 1.12 mg/dL — AB (ref 0.44–1.00)
Chloride: 98 mmol/L — ABNORMAL LOW (ref 101–111)
GFR calc non Af Amer: 55 mL/min — ABNORMAL LOW (ref >60)
GLUCOSE: 162 mg/dL — AB (ref 65–99)
Potassium: 3 mmol/L — ABNORMAL LOW (ref 3.5–5.1)
SODIUM: 136 mmol/L (ref 135–145)
Total Bilirubin: 4.1 mg/dL — ABNORMAL HIGH (ref 0.3–1.2)
Total Protein: 6.1 g/dL — ABNORMAL LOW (ref 6.5–8.1)

## 2015-02-17 LAB — AMMONIA: AMMONIA: 75 umol/L — AB (ref 9–35)

## 2015-02-17 LAB — MRSA PCR SCREENING: MRSA by PCR: NEGATIVE

## 2015-02-17 MED ORDER — POTASSIUM CHLORIDE CRYS ER 20 MEQ PO TBCR
50.0000 meq | EXTENDED_RELEASE_TABLET | Freq: Once | ORAL | Status: AC
  Administered 2015-02-17: 50 meq via ORAL
  Filled 2015-02-17: qty 2

## 2015-02-17 NOTE — Progress Notes (Signed)
[facility] TEAM 1 - Stepdown/ICU TEAM Progress Note  Melissa Stout ZOX:[id number] DOB: 1960-08-18 DOA: 02/11/2015 PCP: Freddy Jaksch, MD  Admit HPI / Brief Narrative: 55 y.o. WF PMHx Depression, Chronic Back Pain, Prediabetes, COPD/Asthma, Liver Cirrhosis with Ascites,  Shortness of breath. Gradual onset. 4 days ago. Associated with wet cough. Albuterol without improvement. Wheezing. Denies fevers, chest pain, palpitations, dysuria, frequency, abdominal pain. Denies abdominal distention. Patient was evaluated in Dr. Hulen Shouts office 2 weeks ago for her hepatic cirrhosis. A small right pleural effusion was noted at that time and was scheduled for return evaluation on 02/13/2015.  HPI/Subjective: 12/31  A/O 4, states increase WOB/SOB, negative CP, negative abdominal pain. Believes her dry weight is 151 pounds (68.4 kg)   Assessment/Plan: Acute respiratory failure with Hypoxia/Pleural Effusion  -12/28 S/P Thorocentesis; 1.5 L removed   : On BiPAP. Likely secondary to massive right pleural effusion and COPD exacerbation. Effusion concerning for malignancy but remote chance its related to cirrhosis/chf/infection. -12/28 S/P Thorocentesis; 1.5 L removed  - Continue Lasix 60 mg  BID; discussed with PCCM who will follow patient as outpatient.  -12/31 Patient now having increased WOB/SOB interested in having effusion drained prior to discharge.  -Obtain PCXR evaluate interval change effusion  -12/31 PCXR; large right pleural effusion bedside/IR paracentesis performed on 02/15/2015 -1/3 SATURATION QUALIFICATIONS: (This note is used to comply with regulatory documentation for home oxygen) Patient Saturations on Room Air at Rest = 93% Patient Saturations on Room Air while Ambulating = 87% Lowest  Patient Saturations on 0 Liters of oxygen while Ambulating = % Did not use O2  Patient needs guidelines for long-term home O2. Patient has a right rapidly reaccumulating pleural effusion. Pleural effusion  accumulates to 72 hours requiring thoracentesis in order for patient to maintain her respiratory status.  -1/3 Spoke with Dr. Rory Percy [facility] CM explained our dilemma that patient's right pleural effusion was recurring every 48-72 hours, and requested they place a Pleurx catheter, since patient appears to need drainage~2 times per week. [facility] requested that we not discharge patient and they would become reengaged in hope make decision on definitive treatment of her frequently recurrent transudate effusion. -Upon discharge will follow-up with PCCM  Chronic diastolic CHF/sinus tachycardia:  -Continue Lasix to PO 60 mg BID - Metoprolol 25 mg  BID  -Strict I&O since admission + 2.4 L -Daily weight admission weight= 72.5 kg               1/3 weight= 71.3 kg  Acute on chronic renal failure (baseline Cr~1.05-1.2) -Cr increased slightly with diuresis   Hepatic Cirrhosis: MELD score 20. Followed by Dr Outlaw/Ascites -Spironolactone 100 mg daily -Lactulose 30 gm daily -Paracentesis attempted by IR however not sufficient fluid for procedure so aborted   GERD: - continue PPI  Anxiety: - continue xanax 0.5 mg PRN QHS  Hypokalemia -Potassium goal> 4 -K-Dur 50 mEq    Code Status: FULL Family Communication: None present at time of exam Disposition Plan: PCCM reconsult await recommendations.    Consultants: Dr.Douglas Leandrew [facility]agi [facility]    Procedure/Significant Events: 12/28 S/P Thorocentesis; 1.5 L removed  12/31 PCXR; large right oral effusion   Culture 12/28 Rt Pleural Fluid NGTD 12/29 urine pending   Antibiotics: Levofloxacin 12/29 1dose   DVT prophylaxis: SCD   Devices    LINES / TUBES:      Continuous Infusions:   Objective: VITAL SIGNS: Temp: 98.3 F (36.8 C) (01/03 1637) Temp Source: Oral (01/03 1637) BP:  108/64 mmHg (01/03 1637) SPO2; FIO2:   Intake/Output Summary (Last 24 hours) at 02/17/15 1651 Last data filed at 02/17/15 0900  Gross per 24  hour  Intake    500 ml  Output      0 ml  Net    500 ml     Exam: General:  A/O 4, NAD.positive acute respiratory distress with exertion Eyes: Negative headache, eye pain, double vision,negative scleral hemorrhage ENT: Negative Runny nose, negative ear pain, negative gingival bleeding, Neck:  Negative scars, masses, torticollis, lymphadenopathy, JVD Lungs: absent RLL breath sounds, remainder of lung clear to auscultation, positive diffuse expiratory wheezes, negative crackles Cardiovascular:  Regular rhythm and rate without murmur gallop or rub normal S1 and S2 Abdomen:negative abdominal pain,  positive distention, negative tenderness,positive soft, bowel sounds, no rebound, negative  ascites, no appreciable mass Extremities: No significant cyanosis, clubbing, or edema bilateral lower extremities Psychiatric:  Negative depression, negative anxiety, negative fatigue, negative mania  Neurologic:  Cranial nerves II through XII intact, tongue/uvula midline, all extremities muscle strength 5/5, sensation intact throughout, negative dysarthria, negative expressive aphasia, negative receptive aphasia.   Data Reviewed: Basic Metabolic Panel:  Recent Labs Lab 02/11/15 1442 02/11/15 1851 02/12/15 0225 02/13/15 0915 02/14/15 0900 02/17/15 0240  NA 137  --  130* 131* 134* 136  K 3.5  --  3.5 3.7 3.7 3.0*  CL 105  --  100* 100* 99* 98*  CO2 23  --  18* 22 26 28   GLUCOSE 129*  --  389* 204* 150* 162*  BUN 5*  --  8 18 23* 10  CREATININE 1.05*  --  1.42* 1.39* 1.43* 1.12*  CALCIUM 8.9  --  8.4* 8.8* 8.8* 8.6*  MG  --  2.3  --  2.0  --   --   PHOS  --  3.4  --   --   --   --    Liver Function Tests:  Recent Labs Lab 02/11/15 1442 02/11/15 1851 02/12/15 0225 02/13/15 0915 02/17/15 0240  AST 88*  --  80* 65* 50*  ALT 22  --  23 22 20   ALKPHOS 102  --  84 91 88  BILITOT 5.9*  --  4.9* 3.8* 4.1*  PROT 7.8 7.7 6.8 6.6 6.1*  ALBUMIN 2.9*  --  2.5* 2.7* 2.5*   No results for  input(s): LIPASE, AMYLASE in the last 168 hours.  Recent Labs Lab 02/13/15 0510 02/17/15 0315  AMMONIA 91* 75*   CBC:  Recent Labs Lab 02/11/15 1442 02/12/15 0225 02/17/15 0240  WBC 7.1 7.9 6.3  NEUTROABS 4.8  --   --   HGB 11.4* 9.9* 10.6*  HCT 32.4* 29.9* 31.5*  MCV 98.8 100.7* 98.4  PLT 112* 79* 77*   Cardiac Enzymes: No results for input(s): CKTOTAL, CKMB, CKMBINDEX, TROPONINI in the last 168 hours. BNP (last 3 results)  Recent Labs  02/11/15 1442  BNP 51.4    ProBNP (last 3 results) No results for input(s): PROBNP in the last 8760 hours.  CBG: No results for input(s): GLUCAP in the last 168 hours.  Recent Results (from the past 240 hour(s))  Body fluid culture     Status: None   Collection Time: 02/11/15  7:35 PM  Result Value Ref Range Status   Specimen Description PLEURAL RIGHT FLUID  Final   Special Requests NONE  Final   Gram Stain   Final    RARE WBC PRESENT,BOTH PMN AND MONONUCLEAR NO ORGANISMS SEEN  Culture NO GROWTH 3 DAYS  Final   Report Status 02/15/2015 FINAL  Final  Urine culture     Status: None   Collection Time: 02/12/15 11:53 AM  Result Value Ref Range Status   Specimen Description URINE, RANDOM  Final   Special Requests NONE  Final   Culture MULTIPLE SPECIES PRESENT, SUGGEST RECOLLECTION  Final   Report Status 02/13/2015 FINAL  Final  Culture, body fluid-bottle     Status: None (Preliminary result)   Collection Time: 02/15/15 10:06 AM  Result Value Ref Range Status   Specimen Description FLUID PLEURAL RIGHT  Final   Special Requests NONE  Final   Culture NO GROWTH 2 DAYS  Final   Report Status PENDING  Incomplete  Gram stain     Status: None   Collection Time: 02/15/15 10:06 AM  Result Value Ref Range Status   Specimen Description FLUID PLEURAL RIGHT  Final   Special Requests NONE  Final   Gram Stain   Final    RARE WBC PRESENT, PREDOMINANTLY MONONUCLEAR NO ORGANISMS SEEN    Report Status 02/15/2015 FINAL  Final  MRSA  PCR Screening     Status: None   Collection Time: 02/17/15  1:54 PM  Result Value Ref Range Status   MRSA by PCR NEGATIVE NEGATIVE Final    Comment:        The GeneXpert MRSA Assay (FDA approved for NASAL specimens only), is one component of a comprehensive MRSA colonization surveillance program. It is not intended to diagnose MRSA infection nor to guide or monitor treatment for MRSA infections.      Studies:  Recent x-ray studies have been reviewed in detail by the Attending Physician  Scheduled Meds:  Scheduled Meds: . aspirin EC  81 mg Oral QHS  . feeding supplement (ENSURE ENLIVE)  237 mL Oral Q24H  . furosemide  60 mg Oral BID  . lactulose  30 g Oral BID  . metoprolol tartrate  25 mg Oral BID  . pantoprazole  80 mg Oral QHS  . rifaximin  550 mg Oral BID  . spironolactone  100 mg Oral Daily    Time spent on care of this patient: 40 mins   Alpheus Stiff, Roselind MessierURTIS J , MD  [facility] Office  [phone] Pager - [phone]  On-Call/Text Page:      [email or web address]      password [email or web address]  If 7PM-7AM, please contact night-coverage [email or web address] Password [email or web address] 02/17/2015, 4:51 PM   LOS: 6 days   Care during the described time interval was provided by me .  I have reviewed this patient's available data, including medical history, events of note, physical examination, and all test results as part of my evaluation. I have personally reviewed and interpreted all radiology studies.   Carolyne Littlesurtis Lariah Fleer, MD [phone] Pager

## 2015-02-17 NOTE — Telephone Encounter (Signed)
-----   Message from Lupita Leashouglas B McQuaid, MD sent at 02/12/2015 10:15 AM EST ----- A,  Can you make a f/u for her in ~2 weeks with TP or me with a CXR for hospital f/u for pleural effusion  Thanks B

## 2015-02-17 NOTE — Care Management Important Message (Signed)
Important Message  Patient Details  Name: Melissa Stout MRN: [id number] Date of Birth: 08/02/1960   Medicare Important Message Given:  Yes    [name] [name] P [name] 02/17/2015, 2:26 PM

## 2015-02-17 NOTE — Progress Notes (Signed)
PULMONARY / CRITICAL CARE MEDICINE   Name: Melissa Stout MRN: [id number] DOB: 12/30/1960    ADMISSION DATE:  02/11/2015  REFERRING MD: [facility]  CHIEF COMPLAINT:  Shortness of breath  SUBJECTIVE: C/o cough and raspy voice when she lays flat or after eating.  She has noticed significant improvement with breathing after last two thoracentesis prodecures.  She reports that her last cigarette and last drink of alcohol were in December 2016.  VITAL SIGNS: BP 108/64 mmHg  Pulse 91  Temp(Src) 98.3 F (36.8 C) (Oral)  Resp 19  Ht 5\' 6"  (1.676 m)  Wt 157 lb 3.2 oz (71.305 kg)  BMI 25.38 kg/m2  SpO2 96%  INTAKE / OUTPUT: I/O last 3 completed shifts: In: 470 [P.O.:470] Out: -   PHYSICAL EXAMINATION: General: sitting in bed on room air Neuro: pleasant, awake, normal strength HEENT: raspy voice Cardiovascular: regular, no murmur Lungs: decreased BS with dullness on Rt, no wheeze Abdomen: soft, nontender Musculoskeletal: no edema Skin: areas of ecchymosis  LABS:  BMET  Recent Labs Lab 02/13/15 0915 02/14/15 0900 02/17/15 0240  NA 131* 134* 136  K 3.7 3.7 3.0*  CL 100* 99* 98*  CO2 22 26 28   BUN 18 23* 10  CREATININE 1.39* 1.43* 1.12*  GLUCOSE 204* 150* 162*   Electrolytes  Recent Labs Lab 02/11/15 1851  02/13/15 0915 02/14/15 0900 02/17/15 0240  CALCIUM  --   < > 8.8* 8.8* 8.6*  MG 2.3  --  2.0  --   --   PHOS 3.4  --   --   --   --   < > = values in this interval not displayed.  CBC  Recent Labs Lab 02/11/15 1442 02/12/15 0225 02/17/15 0240  WBC 7.1 7.9 6.3  HGB 11.4* 9.9* 10.6*  HCT 32.4* 29.9* 31.5*  PLT 112* 79* 77*   Coag's  Recent Labs Lab 02/11/15 1442 02/11/15 2111  APTT 36  --   INR 1.74* 1.90*   Sepsis Markers  Recent Labs Lab 02/13/15 0915 02/13/15 1140 02/14/15 0900  LATICACIDVEN 2.6* 2.3* 1.5   Liver Enzymes  Recent Labs Lab 02/12/15 0225 02/13/15 0915 02/17/15 0240  AST 80* 65* 50*  ALT 23 22 20   ALKPHOS 84 91  88  BILITOT 4.9* 3.8* 4.1*  ALBUMIN 2.5* 2.7* 2.5*   Imaging Dg Chest Port 1 View  02/17/2015  CLINICAL DATA:  Right pleural effusion. EXAM: PORTABLE CHEST 1 VIEW COMPARISON:  02/15/2015. FINDINGS: Heart size stable. Progressive right pleural effusion. Underlying right base atelectasis. Left lung is clear. No pneumothorax. No acute osseous abnormality. IMPRESSION: Progressive large right pleural effusion.  Right base atelectasis. Electronically Signed   By: Maisie Fushomas  Register   On: 02/17/2015 07:30     STUDIES:  12/28 Rt thoracentesis >> 1.5 liters, glucose 150, LDH 34, protein < 3, WBC 81, cytology negative 01/01 Rt thoracentesis >> 1.7 liters, glucose 142, LDH 40, protein < 3, WBC 84  CULTURES: 12/28 Rt pleural fluid >> negative 01/01 Rt pleural fluid >>  ANTIBIOTICS:  DISCUSSION: 55 yo female with dyspnea.  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  She has not noticed significant improvement in symptoms with repeat thoracentesis.  ASSESSMENT / PLAN:  Possible reflux and dysphagia. Plan: - check esophagram  Rt pleural effusion most likely related to hepatic hydrothorax >> not sure this is sole cause of her dyspnea.  She is resting comfortably on room air at present. Plan: - defer repeat thoracentesis  or pleurx catheter for now - continue to optimize medical management of cirrhosis  Alcoholic cirrhosis with ascites and varices. Plan: - per primary team - explained absolute need to never smoke cigarettes or drink alcohol ever again  Hx of smoking and carries dx of COPD. Plan: - prn albuterol - might need PFTs at some point to further assess  Updated pt's family at bedside.  Coralyn Helling, MD [facility] Pulmonary/Critical Care 02/17/2015, 6:14 PM Pager:  [phone] After 3pm call: [phone]

## 2015-02-17 NOTE — Telephone Encounter (Signed)
atc pt, no answer, no vm.  Wcb.  

## 2015-02-17 NOTE — Progress Notes (Addendum)
Pt walked this am with NT, SATS 93% on RA at rest sats ranged 89-91% majority of time walking on RA, with an 87% briefly, but came back up to 89-91% range, pt did have some SOB during walk, but rested a few times during walk to recover from SOB.    SATURATION QUALIFICATIONS: (This note is used to comply with regulatory documentation for home oxygen)  Patient Saturations on Room Air at Rest = 93%  Patient Saturations on Room Air while Ambulating = 87%  Lowest   Patient Saturations on 0 Liters of oxygen while Ambulating = %  Did not use O2   Please briefly explain why patient needs home oxygen:     Pt walked a second time with RN at 1600, SAT 90-92% at rest, with sats of 88-90% on RA during walk. Pt did get SOB during walk and had to stop to rest, sats up to 93% at rest.  Restarting walk sats back down to 88-89% RA, pt did get fatigued (weak and SOB) during second half of walk, had to rest, sats up to 91-92% after resting.   SATURATION QUALIFICATIONS: (This note is used to comply with regulatory documentation for home oxygen)  Patient Saturations on Room Air at Rest = 90-92%  Patient Saturations on Room Air while Ambulating = 88-90%  Patient Saturations on 0 Liters of oxygen while Ambulating = %  Did not use O2      Please briefly explain why patient needs home oxygen:

## 2015-02-18 ENCOUNTER — Inpatient Hospital Stay (HOSPITAL_COMMUNITY): Payer: Commercial Managed Care - HMO

## 2015-02-18 MED ORDER — FUROSEMIDE 80 MG PO TABS
80.0000 mg | ORAL_TABLET | Freq: Two times a day (BID) | ORAL | Status: DC
  Administered 2015-02-18 – 2015-02-19 (×2): 80 mg via ORAL
  Filled 2015-02-18 (×2): qty 1

## 2015-02-18 MED ORDER — NICOTINE 14 MG/24HR TD PT24
14.0000 mg | MEDICATED_PATCH | Freq: Every day | TRANSDERMAL | Status: DC
Start: 2015-02-18 — End: 2015-02-19
  Administered 2015-02-18 – 2015-02-19 (×2): 14 mg via TRANSDERMAL
  Filled 2015-02-18 (×2): qty 1

## 2015-02-18 MED ORDER — SPIRONOLACTONE 25 MG PO TABS
200.0000 mg | ORAL_TABLET | Freq: Every day | ORAL | Status: DC
  Administered 2015-02-19: 200 mg via ORAL
  Filled 2015-02-18: qty 8

## 2015-02-18 NOTE — Progress Notes (Signed)
Nutrition Follow-up  DOCUMENTATION CODES:   Not applicable  INTERVENTION:   -Continue Ensure Enlive po daily, each supplement provides 350 kcal and 20 grams of protein  NUTRITION DIAGNOSIS:   Increased nutrient needs related to chronic illness as evidenced by estimated needs.  Ongoing  GOAL:   Patient will meet greater than or equal to 90% of their needs  Progressing  MONITOR:   PO intake, Supplement acceptance, Labs, Weight trends, Skin, I & O's  REASON FOR ASSESSMENT:   Consult Assessment of nutrition requirement/status  ASSESSMENT:   55 year old female with a past medical history significant for hepatic cirrhosis and ascites presents to the emergency department with a large right-sided pleural effusion. 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 However, the differential diagnosis does include all the common causes of transudate and exudate. The best approach moving forward is to perform an emergent thoracentesis to relieve her work of breathing and also for diagnostic purposes.  Pt consuming breakfast and speaking in [facility] at time of visit.   Pt underwent CT of abdomen on 02/12/15, which revealed no ascites. Underwent repeat rt thoracentesis.   PCCM following due to recurrent rt pleural effusions. Per CCM note from this AM, deferring repeat thoracentesis andplacement of pleurex catheter at this time and recommending medical optimization of cirrhosis.   Appetite remains good. Meal completion 50-100%. Pt has been transitioned to a heart healthy diet. She is accepting Ensure supplement about 50% of the time per [facility].  Labs reviewed: K: 3.0.  Diet Order:  Diet heart healthy/carb modified Room service appropriate?: Yes; Fluid consistency:: Thin  Skin:  Reviewed, no issues  Last BM:  02/15/15  Height:   Ht Readings from Last 1 Encounters:  02/11/15 5\' 6"  (1.676 m)    Weight:   Wt Readings from Last 1 Encounters:  02/18/15 159 lb 6.3 oz (72.3  kg)    Ideal Body Weight:  59.1 kg  BMI:  Body mass index is 25.74 kg/(m^2).  Estimated Nutritional Needs:   Kcal:  1600-1800  Protein:  75-90 grams  Fluid:  1.6-1.8 L  EDUCATION NEEDS:   Education needs addressed  Vandy Fong A. Mayford KnifeWilliams, RD, LDN, CDE Pager: [phone] After hours Pager: [phone]

## 2015-02-18 NOTE — Consult Note (Addendum)
   [facility]   02/18/2015  Melissa Stout A Boodram 11/21/1960 [id number]   Patient evaluated for [facility] services. Came to bedside to speak with her about [facility] program. She is agreeable and written consent obtained. She endorses that she lives with her mother. She confirms her Primary Care MD is Dr[facility] Joyc[facility]Stephen [facility]. She also confirms best contact number is [phone]. Denies having issues with affording medications or with transportation. Will request to assign to [facility] RNCM. Explained to patient that [facility] Care [facility] will not interfere or replace services provided by home health. [facility] packet and contact information left at bedside. Appreciative of visit. Inpatient RNCM aware.   Raiford NobleAtika Hall, MSN-Ed, RN,BSN [facility] Care Management Hospital Liaison [phone]

## 2015-02-18 NOTE — Progress Notes (Signed)
PULMONARY / CRITICAL CARE MEDICINE   Name: Melissa Stout MRN: [id number] DOB: 1960-09-15    ADMISSION DATE:  02/11/2015  REFERRING MD: [facility]  CHIEF COMPLAINT:  Shortness of breath  SUBJECTIVE: No complaints. States that her breathing is OK.  She reports that her last cigarette and last drink of alcohol were in December 2016.   VITAL SIGNS: BP 97/66 mmHg  Pulse 90  Temp(Src) 98.5 F (36.9 C) (Oral)  Resp 24  Ht 5\' 6"  (1.676 m)  Wt 159 lb 6.3 oz (72.3 kg)  BMI 25.74 kg/m2  SpO2 94%  INTAKE / OUTPUT: I/O last 3 completed shifts: In: 962 [P.O.:962] Out: 250 [Urine:250]  PHYSICAL EXAMINATION: General: No  Distress, on room air Neuro:  No gross focal deficits HEENT:  Moist mucous membranes Cardiovascular: regular, no murmur Lungs:  Decreased breath sounds on the right, no wheeze, crackles Abdomen: soft, nontender Musculoskeletal: no edema Skin: areas of ecchymosis  LABS:  BMET  Recent Labs Lab 02/13/15 0915 02/14/15 0900 02/17/15 0240  NA 131* 134* 136  K 3.7 3.7 3.0*  CL 100* 99* 98*  CO2 22 26 28   BUN 18 23* 10  CREATININE 1.39* 1.43* 1.12*  GLUCOSE 204* 150* 162*   Electrolytes  Recent Labs Lab 02/11/15 1851  02/13/15 0915 02/14/15 0900 02/17/15 0240  CALCIUM  --   < > 8.8* 8.8* 8.6*  MG 2.3  --  2.0  --   --   PHOS 3.4  --   --   --   --   < > = values in this interval not displayed.  CBC  Recent Labs Lab 02/11/15 1442 02/12/15 0225 02/17/15 0240  WBC 7.1 7.9 6.3  HGB 11.4* 9.9* 10.6*  HCT 32.4* 29.9* 31.5*  PLT 112* 79* 77*   Coag's  Recent Labs Lab 02/11/15 1442 02/11/15 2111  APTT 36  --   INR 1.74* 1.90*   Sepsis Markers  Recent Labs Lab 02/13/15 0915 02/13/15 1140 02/14/15 0900  LATICACIDVEN 2.6* 2.3* 1.5   Liver Enzymes  Recent Labs Lab 02/12/15 0225 02/13/15 0915 02/17/15 0240  AST 80* 65* 50*  ALT 23 22 20   ALKPHOS 84 91 88  BILITOT 4.9* 3.8* 4.1*  ALBUMIN 2.5* 2.7* 2.5*   Imaging Dg  Esophagus  02/18/2015  CLINICAL DATA:  Short of breath with pleural effusions. Evaluate for reflux. EXAM: ESOPHOGRAM/BARIUM SWALLOW TECHNIQUE: Combined double contrast and single contrast examination performed using effervescent crystals, thick barium liquid, and thin barium liquid. FLUOROSCOPY TIME:  If the device does not provide the exposure index: Fluoroscopy Time:  2 minutes and 39 seconds Number of Acquired Images:  None COMPARISON:  Chest radiograph of 02/17/2015 FINDINGS: Double contrast evaluation of the esophagus demonstrates no mucosal abnormality. Evaluation of primary peristalsis demonstrates a normal primary peristaltic wave on each swallow. Full column evaluation of the esophagus demonstrates no persistent narrowing or stricture. Suspect minimal gastroesophageal reflux, spontaneously, including on series 33. With the "Water siphon test" no reflux is induced. IMPRESSION: 1. Suspicion of minimal spontaneous gastroesophageal reflux spontaneously. No inducible reflux with the water siphon test . 2. Otherwise normal esophagram. Electronically Signed   By: Jeronimo Greaves M.D.   On: 02/18/2015 10:04     STUDIES:  12/28 Rt thoracentesis >> 1.5 liters, glucose 150, LDH 34, protein < 3, WBC 81, cytology negative 01/01 Rt thoracentesis >> 1.7 liters, glucose 142, LDH 40, protein < 3, WBC 84 1/4 Esophagogram >>  Minimal GERD, no reflux, aspiration  CULTURES:  12/28 Rt pleural fluid >> negative 01/01 Rt pleural fluid >> negative  ANTIBIOTICS:  DISCUSSION: 55 yo female with dyspnea.  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   She's had 2 thoracentesis which showed an transudative effusion. All cultures and cytology are negative.   As she is not in respiratory distress and has good sats on RA, there is no need for repeat thoracentesis, Pleurx catheter. She will need optimization of her cirrhosis and fluid status.  ASSESSMENT / PLAN: Recurrent right hepatic  hydrothorax.  - Defer repeat thoracentesis or pleurx catheter for now - Continue to optimize medical management of cirrhosis - Increase Aldactone to 200 mg qd and lasix to 80 mg qd. Can increase dose every 3-5 days upto 400 mg/160 mg qd of aldactone and lasix as tolerated.  PCCM will sign off. Please call back with any new questions.  Chilton GreathousePraveen Timiya Howells MD [facility] Pager [phone] If no answer or after 3pm call: [phone] 02/18/2015, 10:38 AM

## 2015-02-18 NOTE — Telephone Encounter (Signed)
Called mobile and LMTCB

## 2015-02-18 NOTE — Progress Notes (Signed)
[facility] TEAM 1 - Stepdown/ICU TEAM PROGRESS NOTE  Ala BentLou A Wubben ZOX:[id number] DOB: 07/03/1960 DOA: 02/11/2015 PCP: Freddy JakschUTLAW,WILLIAM M, MD  Admit HPI / Brief Narrative: 55 yo F Hx Depression, Chronic Back Pain, Prediabetes, COPD/Asthma, and Liver Cirrhosis with Ascites who presented w/ a 4 day hx of gradually worsening SOB associated with wet cough. Patient was evaluated in Dr. Hulen Shoutsutlaw's office 2 weeks prior for her hepatic cirrhosis. A small right pleural effusion was noted at that time and she was scheduled for return evaluation on 02/13/2015.  HPI/Subjective: Pt is somnolent, but in no apparent distress when resting.  Tells me she has not yet been up/ambulated.  Denies cp, sob, n/v, or abdom pain.    Assessment/Plan:  Large recurrent R hepatic hydrothorax  > Acute respiratory failure with Hypoxia -12/28 1.5L thoracentesis per PCCM -12/29 planned paracentesis but no ascites on US   -12/31 CXR large right pleural effusion rapidly reaccumulated  -1/1 1.7L R thoracentesis  -resp status presently stable at rest - plan to maximize medical tx of cirrhosis/effusion and attempt to avoid further thoracentesis if able - research suggests indwelling drainage catheter is not appropriate for hepatic hydrothorax  -can consider pleurodesis, TIPS, or even liver transplant in future if issue persists   Hepatic Cirrhosis due to EtOH abuse -followed by Dr Dulce Sellarutlaw - reportedly was drinking as early as Dec 2016, making transplant an invalid option presently  -continue usual medical tx - maximize diuretic tx - strongly encourage compliance w/ low salt diet  -Paracentesis attempted by IR however not sufficient fluid for procedure so aborted   Chronic diastolic CHF / sinus tachycardia -admission weight 72.5 kg- presently 72.3kg - cont scheduled diuretics   Acute on chronic renal failure (baseline Cr~1.05-1.2) -Cr stabilizing despite diuresis - follow trend   GERD -continue PPI  Anxiety -continue xanax  0.5 mg PRN QHS  Code Status: FULL Family Communication: no family present at time of exam Disposition Plan: SDU - PT/OT evals - possible d/c home in next 24-48hrs if resp status/effusion stable   Consultants: PCCM IR  Antibiotics: None   DVT prophylaxis: SCDs  Objective: Blood pressure 89/51, pulse 90, temperature 97.9 F (36.6 C), temperature source Oral, resp. rate 16, height 5\' 6"  (1.676 m), weight 72.3 kg (159 lb 6.3 oz), SpO2 95 %.  Intake/Output Summary (Last 24 hours) at 02/18/15 1416 Last data filed at 02/18/15 1300  Gross per 24 hour  Intake    960 ml  Output    250 ml  Net    710 ml   Exam: General: No acute respiratory distress at rest in bed  Lungs: poor air movement th/o R fields - no wheeze  Cardiovascular: Regular rate and rhythm without murmur Abdomen: Nontender, soft, bowel sounds positive, no rebound, no ascites, no appreciable mass Extremities: No significant cyanosis, clubbing, edema bilateral lower extremities  Data Reviewed:  Basic Metabolic Panel:  Recent Labs Lab 02/11/15 1442 02/11/15 1851 02/12/15 0225 02/13/15 0915 02/14/15 0900 02/17/15 0240  NA 137  --  130* 131* 134* 136  K 3.5  --  3.5 3.7 3.7 3.0*  CL 105  --  100* 100* 99* 98*  CO2 23  --  18* 22 26 28   GLUCOSE 129*  --  389* 204* 150* 162*  BUN 5*  --  8 18 23* 10  CREATININE 1.05*  --  1.42* 1.39* 1.43* 1.12*  CALCIUM 8.9  --  8.4* 8.8* 8.8* 8.6*  MG  --  2.3  --  2.0  --   --   PHOS  --  3.4  --   --   --   --     CBC:  Recent Labs Lab 02/11/15 1442 02/12/15 0225 02/17/15 0240  WBC 7.1 7.9 6.3  NEUTROABS 4.8  --   --   HGB 11.4* 9.9* 10.6*  HCT 32.4* 29.9* 31.5*  MCV 98.8 100.7* 98.4  PLT 112* 79* 77*    Liver Function Tests:  Recent Labs Lab 02/11/15 1442 02/11/15 1851 02/12/15 0225 02/13/15 0915 02/17/15 0240  AST 88*  --  80* 65* 50*  ALT 22  --  23 22 20   ALKPHOS 102  --  84 91 88  BILITOT 5.9*  --  4.9* 3.8* 4.1*  PROT 7.8 7.7 6.8 6.6 6.1*    ALBUMIN 2.9*  --  2.5* 2.7* 2.5*    Recent Labs Lab 02/13/15 0510 02/17/15 0315  AMMONIA 91* 75*    Coags:  Recent Labs Lab 02/11/15 1442 02/11/15 2111  INR 1.74* 1.90*    Recent Labs Lab 02/11/15 1442  APTT 36    Recent Results (from the past 240 hour(s))  Body fluid culture     Status: None   Collection Time: 02/11/15  7:35 PM  Result Value Ref Range Status   Specimen Description PLEURAL RIGHT FLUID  Final   Special Requests NONE  Final   Gram Stain   Final    RARE WBC PRESENT,BOTH PMN AND MONONUCLEAR NO ORGANISMS SEEN    Culture NO GROWTH 3 DAYS  Final   Report Status 02/15/2015 FINAL  Final  Urine culture     Status: None   Collection Time: 02/12/15 11:53 AM  Result Value Ref Range Status   Specimen Description URINE, RANDOM  Final   Special Requests NONE  Final   Culture MULTIPLE SPECIES PRESENT, SUGGEST RECOLLECTION  Final   Report Status 02/13/2015 FINAL  Final  Culture, body fluid-bottle     Status: None (Preliminary result)   Collection Time: 02/15/15 10:06 AM  Result Value Ref Range Status   Specimen Description FLUID PLEURAL RIGHT  Final   Special Requests NONE  Final   Culture NO GROWTH 3 DAYS  Final   Report Status PENDING  Incomplete  Gram stain     Status: None   Collection Time: 02/15/15 10:06 AM  Result Value Ref Range Status   Specimen Description FLUID PLEURAL RIGHT  Final   Special Requests NONE  Final   Gram Stain   Final    RARE WBC PRESENT, PREDOMINANTLY MONONUCLEAR NO ORGANISMS SEEN    Report Status 02/15/2015 FINAL  Final  MRSA PCR Screening     Status: None   Collection Time: 02/17/15  1:54 PM  Result Value Ref Range Status   MRSA by PCR NEGATIVE NEGATIVE Final    Comment:        The GeneXpert MRSA Assay (FDA approved for NASAL specimens only), is one component of a comprehensive MRSA colonization surveillance program. It is not intended to diagnose MRSA infection nor to guide or monitor treatment for MRSA  infections.      Studies:   Recent x-ray studies have been reviewed in detail by the Attending Physician  Scheduled Meds:  Scheduled Meds: . aspirin EC  81 mg Oral QHS  . feeding supplement (ENSURE ENLIVE)  237 mL Oral Q24H  . furosemide  80 mg Oral BID  . lactulose  30 g Oral BID  . metoprolol tartrate  25  mg Oral BID  . pantoprazole  80 mg Oral QHS  . rifaximin  550 mg Oral BID  . [START ON 02/19/2015] spironolactone  200 mg Oral Daily    Time spent on care of this patient: 35 mins   Quinteria Chisum T , MD   [facility] Office  [phone] Pager - Text Page per Loretha Stapler as per below:  On-Call/Text Page:      [email or web address]      password [email or web address]  If 7PM-7AM, please contact night-coverage [email or web address] Password [email or web address] 02/18/2015, 2:16 PM   LOS: 7 days

## 2015-02-18 NOTE — Progress Notes (Signed)
Alerted jermaine w [facility] of new order for home o2. Port ot2 should be del to room.

## 2015-02-18 NOTE — Progress Notes (Signed)
Patient resting comfortably and in no apparent respiratory distress.  No immediate need for Bipap.  RT will continue to follow.

## 2015-02-18 NOTE — Progress Notes (Signed)
PT Cancellation Note  Patient Details Name: Melissa Stout MRN: [id number] DOB: 01/22/1961   Cancelled Treatment:    Reason Eval/Treat Not Completed: Fatigue/lethargy limiting ability to participate; patient lethargic/sleepy and requests PT to attempt tomorrow.     Melissa McgregorCynthia Stout 02/18/2015, 3:18 PM  Melissa Stout, PT [phone] 02/18/2015

## 2015-02-18 NOTE — Progress Notes (Signed)
SATURATION QUALIFICATIONS: (This note is used to comply with regulatory documentation for home oxygen)  Patient Saturations on Room Air at rest 87%

## 2015-02-19 ENCOUNTER — Inpatient Hospital Stay (HOSPITAL_COMMUNITY): Payer: Commercial Managed Care - HMO

## 2015-02-19 DIAGNOSIS — J948 Other specified pleural conditions: Secondary | ICD-10-CM | POA: Diagnosis present

## 2015-02-19 DIAGNOSIS — R0602 Shortness of breath: Secondary | ICD-10-CM

## 2015-02-19 LAB — COMPREHENSIVE METABOLIC PANEL
ALBUMIN: 2.5 g/dL — AB (ref 3.5–5.0)
ALK PHOS: 99 U/L (ref 38–126)
ALT: 20 U/L (ref 14–54)
ANION GAP: 10 (ref 5–15)
AST: 55 U/L — ABNORMAL HIGH (ref 15–41)
BILIRUBIN TOTAL: 3.7 mg/dL — AB (ref 0.3–1.2)
BUN: 8 mg/dL (ref 6–20)
CALCIUM: 9.1 mg/dL (ref 8.9–10.3)
CO2: 27 mmol/L (ref 22–32)
Chloride: 99 mmol/L — ABNORMAL LOW (ref 101–111)
Creatinine, Ser: 0.98 mg/dL (ref 0.44–1.00)
GFR calc Af Amer: >60 mL/min (ref >60)
GLUCOSE: 102 mg/dL — AB (ref 65–99)
POTASSIUM: 3.3 mmol/L — AB (ref 3.5–5.1)
Sodium: 136 mmol/L (ref 135–145)
TOTAL PROTEIN: 6.8 g/dL (ref 6.5–8.1)

## 2015-02-19 LAB — CBC
HEMATOCRIT: 33.3 % — AB (ref 36.0–46.0)
HEMOGLOBIN: 11.1 g/dL — AB (ref 12.0–15.0)
MCH: 32.8 pg (ref 26.0–34.0)
MCHC: 33.3 g/dL (ref 30.0–36.0)
MCV: 98.5 fL (ref 78.0–100.0)
Platelets: 95 10*3/uL — ABNORMAL LOW (ref 150–400)
RBC: 3.38 MIL/uL — ABNORMAL LOW (ref 3.87–5.11)
RDW: 15 % (ref 11.5–15.5)
WBC: 6.9 10*3/uL (ref 4.0–10.5)

## 2015-02-19 MED ORDER — LACTULOSE 10 GM/15ML PO SOLN: 30.0000 g | Freq: Two times a day (BID) | ORAL | Status: AC

## 2015-02-19 MED ORDER — SPIRONOLACTONE 100 MG PO TABS: 200.0000 mg | ORAL_TABLET | Freq: Every day | ORAL | Status: AC

## 2015-02-19 MED ORDER — NICOTINE 14 MG/24HR TD PT24: 14.0000 mg | MEDICATED_PATCH | Freq: Every day | TRANSDERMAL | Status: DC

## 2015-02-19 MED ORDER — FUROSEMIDE 20 MG PO TABS: 80.0000 mg | ORAL_TABLET | Freq: Two times a day (BID) | ORAL | Status: AC

## 2015-02-19 MED ORDER — RIFAXIMIN 550 MG PO TABS: 550.0000 mg | ORAL_TABLET | Freq: Two times a day (BID) | ORAL | Status: DC

## 2015-02-19 NOTE — Progress Notes (Signed)
OT Cancellation Note and Discharge  Patient Details Name: Melissa Stout MRN: [id number] DOB: 10/21/1960   Cancelled Treatment:    Reason Eval/Treat Not Completed: Other (comment). OT evaled and D/C'd pt on 02/13/15. We received a new order late yesterday. Just spoke with nurse and she is in the middle of getting pt's D/C papers ready. PT has recommended [facility]. OT will not re-eval.  Melissa Stout, Devory Mckinzie Eva [phone] 02/19/2015, 2:29 PM

## 2015-02-19 NOTE — Progress Notes (Signed)
Patient given discharge instructions/education and signed copy placed in patient's chart. Patient has no questions or concerns at this time. Belongings returned and sent home with patient including O2 tank from [facility]. Patient escorted off unit in wheelchair with family member and NT.

## 2015-02-19 NOTE — Discharge Summary (Signed)
Physician Discharge Summary  Melissa Stout:[id number] DOB: [birth year] DOA: 02/11/2015  PCP: Freddy Jaksch, MD  Admit date: 02/11/2015 Discharge date: 02/19/2015  Time spent: 35 minutes  Recommendations for Outpatient Follow-up:  Acute respiratory failure with Hypoxia/Pleural Effusion  -12/28 S/P Thorocentesis; 1.5 L removed  -initially on BiPAP. secondary to massive right pleural effusion and COPD exacerbation. Effusion secondary to liver cirrhosis -12/28 S/P Thorocentesis; 1.5 L removed  - Continue Lasix 60 mg BID;  -1/1 S/P right side thoracentesis; 1.7 L removed. SOB resolved with thoracentesis -Schedule follow-up with Dr. Willis Modena PCP in one week, hepatic cirrhosis, acute on chronic renal failure, recurrent right sided pleural effusion -1/3 SATURATION QUALIFICATIONS: (This note is used to comply with regulatory documentation for home oxygen) Patient Saturations on Room Air at Rest = 93% Patient Saturations on Room Air while Ambulating = 87% Lowest  Patient Saturations on 0 Liters of oxygen while Ambulating = % Did not use O2  Patient needs guidelines for long-term home O2. Patient has a right rapidly reaccumulating pleural effusion. Pleural effusion accumulates to 72 hours requiring thoracentesis in order for patient to maintain her respiratory status.  -1/5 CXR shows right-sided pleural effusion continues to increase in size, despite maximum conservative treatment -patient counseled that she will need to be on a low sodium diet; < 2 gm per day -counseling will need to take her diuretics religiously -scheduled follow-up appointment to see PCCM Dr.Brent McQuaid in 1 weeks post discharge. Recurrent right pleural effusion  Chronic diastolic CHF/sinus tachycardia:  -Continue Lasix to PO 80 mg BID - Metoprolol 25 mg BID  -Daily weight admission weight= 72.5 kg 1/5 standing weight= 72kg -Patient counseled to weigh self daily, and maintain a logbook. -If  she gains more than 3 pounds in 24 hours contact cardiologist or PCCM for instructions on increasing diuretic  Acute on chronic renal failure (baseline Cr~1.05-1.2) -Cr normalized  Hepatic Cirrhosis: MELD score 20. Followed by Dr Outlaw/Ascites -Spironolactone 200 mg daily -Rifaximin 550 mg BID -Lactulose 30 gm BID -Paracentesis attempted by IR however not sufficient fluid for procedure so aborted  -counseled on absolute abstinence of alcohol. -Counseled on low sodium diet. Sodium< 2gm per day  GERD: - continue PPI  Anxiety: - continue xanax 0.5 mg PRN QHS    Discharge Diagnoses:  Active Problems:   Cirrhosis of liver with ascites (HCC)   Pleural effusion, right   Acute respiratory failure (HCC)   Chronic diastolic CHF (congestive heart failure) (HCC)   GERD (gastroesophageal reflux disease)   Anxiety   Pleural effusion   Ascites   S/P thoracentesis   Pleural effusion on right   Acute respiratory failure with hypoxia (HCC)   Recurrent pleural effusion on right   Acute on chronic renal failure (HCC)   Alcoholic cirrhosis of liver with ascites (HCC)   Hypokalemia   Dyspnea   Hydrothorax   Discharge Condition: Stable  Diet recommendation: Heart healthy/low-sodium diet; < 2 gm sodium per day  Filed Weights   02/17/15 0417 02/18/15 0430 02/19/15 0416  Weight: 71.305 kg (157 lb 3.2 oz) 72.3 kg (159 lb 6.3 oz) 72 kg (158 lb 11.7 oz)    History of present illness:  55 y.o. WF PMHx Depression, Chronic Back Pain, Prediabetes, COPD/Asthma, Liver Cirrhosis with Ascites,  Shortness of breath. Gradual onset. 4 days ago. Associated with wet cough. Albuterol without improvement. Wheezing. Denies fevers, chest pain, palpitations, dysuria, frequency, abdominal pain. Denies abdominal distention. Patient was evaluated in Dr. Hulen Shouts office 55 y.o. WF PMHx Depression, Chronic Back Pain, Prediabetes, COPD/Asthma, Liver Cirrhosis with Ascites,  Shortness of breath. Gradual onset. 4 days ago. Associated with wet cough. Albuterol without improvement. Wheezing. Denies fevers, chest pain, palpitations, dysuria, frequency, abdominal pain. Denies abdominal distention. Patient was evaluated in Dr. Hulen Shouts office 55 weeks  ago for her hepatic cirrhosis. A small right pleural effusion was noted at that time and was scheduled for return evaluation on  02/13/2015. During his hospitalization patient was treated for acute respiratory failure with hypoxia secondary to large pleural effusion. Prior to discharge patient has received 2 thoracentesis, initial thoracentesis removed 1.5 L, second thoracentesis removed 1.7 L. Each fluid sample were sent for culture stain/cytology. In addition patient was treated for Chronic Diastolic CHF, as well as hepatic cirrhosis. NOTE; page to bedside just prior to patient departing, patient again having positive DOE, positive SOB. Obtained PCXR which showed residual right sided pleural effusion/pulmonary congestion but significantly improved.  Consultants: Dr.Douglas Leandrew Koyanagi [facility] M PA Ralene Muskrat, [place]   Procedure/Significant Events: 12/28 S/P Thorocentesis; 1.5 L removed  12/31 PCXR; large right pleural effusion 02/15/2015 S/P Thoracentesis; 1.7 L removed    Culture 12/28 Rt Pleural Fluid NGTD 12/29 urine negative final   Antibiotics: Levofloxacin 12/29  1dose    Discharge Exam: Filed Vitals:   02/19/15 0416 02/19/15 0819 02/19/15 0932 02/19/15 1156  BP: 90/51 95/45 111/67 111/71  Pulse: 88 82 92 85  Temp: 97.9 F (36.6 C) 97.9 F (36.6 C)  98.7 F (37.1 C)  TempSrc: Oral Oral  Oral  Resp: 16 16  16   Height:      Weight: 72 kg (158 lb 11.7 oz)     SpO2: 95% 96%  95%    General: A/O 4, NAD.No acute respiratory distress Eyes: Negative headache, eye pain, double vision,negative scleral hemorrhage ENT: Negative Runny nose, negative ear pain, negative gingival bleeding, Neck: Negative scars, masses, torticollis, lymphadenopathy, JVD Lungs: clear to auscultation on the left with mild expiratory wheezing, RUL positive breath sounds, RML/RLL negative breath sounds appreciated  Cardiovascular: Regular rhythm and rate without murmur gallop or rub normal S1 and S2 Abdomen:negative abdominal pain, positive distention, negative tenderness,positive soft, bowel sounds, no rebound, negative  ascites, no appreciable mass  Discharge Instructions      Discharge Instructions    AMB Referral to [facility] Care Management    Complete by:  As directed   Please assign to [facility] RNCM for transition of care. Multiple co-morbities. Written consent obtained. Currently in hospital. Please call with questions. Thanks. Raiford Noble, MSN-Ed, RN,BSN-THN [facility] Liaison-[phone]  Reason for consult:  Please assign to [facility] RNCM  Expected date of contact:  1-3 days (reserved for hospital discharges)            Medication List    STOP taking these medications        amoxicillin-clavulanate 875-125 MG tablet  Commonly known as:  AUGMENTIN     metoprolol succinate 25 MG 24 hr tablet  Commonly known as:  TOPROL-XL      TAKE these medications        ALPRAZolam 0.5 MG tablet  Commonly known as:  XANAX  Take 0.5 mg by mouth 2 (two) times daily as needed for anxiety.     aspirin EC 81 MG tablet  Take 81 mg by mouth at bedtime.     furosemide 20 MG tablet  Commonly known as:  LASIX  Take 4 tablets (80 mg total) by mouth 2 (two) times daily.     lactulose 10 GM/15ML solution  Commonly known as:  CHRONULAC  Take 45 mLs (30 g total) by mouth daily. Please titrate dose to have 2-3 Bowel Movement a day     lactulose 10 GM/15ML solution  Commonly known as:  CHRONULAC  Take  45 mLs (30 g total) by mouth 2 (two) times daily.     metoprolol tartrate 25 MG tablet  Commonly known as:  LOPRESSOR  Take 1 tablet (25 mg total) by mouth 2 (two) times daily.     nicotine 14 mg/24hr patch  Commonly known as:  NICODERM CQ - dosed in mg/24 hours  Place 1 patch (14 mg total) onto the skin daily.     omeprazole 40 MG capsule  Commonly known as:  PRILOSEC  Take 40 mg by mouth daily.     Oxycodone HCl 10 MG Tabs  Take 10 mg by mouth every 6 (six) hours as needed (pain).     PROAIR HFA 108 (90 Base) MCG/ACT inhaler  Generic drug:  albuterol  Inhale 2 puffs into the lungs every 6 (six)  hours as needed for wheezing or shortness of breath.     rifaximin 550 MG Tabs tablet  Commonly known as:  XIFAXAN  Take 1 tablet (550 mg total) by mouth 2 (two) times daily.     spironolactone 100 MG tablet  Commonly known as:  ALDACTONE  Take 2 tablets (200 mg total) by mouth daily.       Allergies  Allergen Reactions  . Zestril [Lisinopril] Cough   Follow-up Information    Schedule an appointment as soon as possible for a visit with Max Fickle, MD.   Specialty:  Pulmonary Disease   Why:  scheduled follow-up appointment to see PCCM Dr.Brent McQuaid in 1 weeks post discharge. Recurrent right pleural effusion   Contact information:   [address] [place] [address] [phone]       Follow up with Freddy Jaksch, MD. Schedule an appointment as soon as possible for a visit in 1 week.   Specialty:  Gastroenterology   Why:  Schedule follow-up with Dr. Willis Modena PCP in one week, hepatic cirrhosis, acute on chronic renal failure, recurrent right sided pleural effusion   Contact information:   [address] [place] [address] [phone]        The results of significant diagnostics from this hospitalization (including imaging, microbiology, ancillary and laboratory) are listed below for reference.    Significant Diagnostic Studies: Dg Chest 1 View  02/15/2015  CLINICAL DATA:  55 year old female with status post thoracentesis for right pleural effusion. EXAM: CHEST 1 VIEW COMPARISON:  02/14/2015 and prior studies FINDINGS: Right pleural effusion has decreased in size since the prior study with continued right lower lung atelectasis. There is no evidence of pneumothorax. No other significant changes identified. IMPRESSION: Decreased right pleural effusion.  No evidence of pneumothorax. Electronically Signed   By: Harmon Pier M.D.   On: 02/15/2015 10:33   Dg Esophagus  02/18/2015  CLINICAL DATA:  Short of breath with pleural effusions. Evaluate for  reflux. EXAM: ESOPHOGRAM/BARIUM SWALLOW TECHNIQUE: Combined double contrast and single contrast examination performed using effervescent crystals, thick barium liquid, and thin barium liquid. FLUOROSCOPY TIME:  If the device does not provide the exposure index: Fluoroscopy Time:  2 minutes and 39 seconds Number of Acquired Images:  None COMPARISON:  Chest radiograph of 02/17/2015 FINDINGS: Double contrast evaluation of the esophagus demonstrates no mucosal abnormality. Evaluation of primary peristalsis demonstrates a normal primary peristaltic wave on each swallow. Full column evaluation of the esophagus demonstrates no persistent narrowing or stricture. Suspect minimal gastroesophageal reflux, spontaneously, including on series 33. With the "Water siphon test" no reflux is induced. IMPRESSION: 1. Suspicion of minimal spontaneous gastroesophageal reflux  spontaneously. No inducible reflux with the water siphon test . 2. Otherwise normal esophagram. Electronically Signed   By: Jeronimo GreavesKyle  Talbot M.D.   On: 02/18/2015 10:04   [place] Abdomen Limited  02/12/2015  CLINICAL DATA:  Abdominal distention EXAM: LIMITED ABDOMEN ULTRASOUND FOR ASCITES TECHNIQUE: Limited ultrasound survey for ascites was performed in all four abdominal quadrants. COMPARISON:  February 02, 2015 FINDINGS: There is peristalsing bowel throughout the abdomen pelvis. There is no appreciable ascites. IMPRESSION: No appreciable ascites. Electronically Signed   By: Bretta BangWilliam  Woodruff III M.D.   On: 02/12/2015 14:42   [place] Abdomen Limited  02/02/2015  CLINICAL DATA:  Cirrhosis.  Evaluate for ascites. EXAM: LIMITED ABDOMEN ULTRASOUND FOR ASCITES TECHNIQUE: Limited ultrasound survey for ascites was performed in all four abdominal quadrants. COMPARISON:  08/20/2014 abdominal sonogram. FINDINGS: There is trace perihepatic ascites anterior to the liver on the right upper quadrant image. No ascites is seen in the left upper quadrant or either lower quadrant.  IMPRESSION: Trace perihepatic ascites. No significant drainable pocket of ascites. Electronically Signed   By: Delbert PhenixJason A Poff M.D.   On: 02/02/2015 12:40   Dg Chest Port 1 View  02/19/2015  CLINICAL DATA:  Follow-up hydrothorax EXAM: PORTABLE CHEST 1 VIEW COMPARISON:  Portable chest x-ray of February 17, 2015 FINDINGS: The volume of pleural fluid on the right is increased in on a very small amount of residual aerated right upper lobe is demonstrated. There is no mediastinal shift. The left lung is well-expanded and clear. The left heart border is normal. The pulmonary vascularity is not engorged. No pneumothorax is observed. The observed bony thorax is unremarkable. IMPRESSION: Interval increase in the volume of the large right pleural effusion. Electronically Signed   By: David  SwazilandJordan M.D.   On: 02/19/2015 07:23   Dg Chest Port 1 View  02/17/2015  CLINICAL DATA:  Right pleural effusion. EXAM: PORTABLE CHEST 1 VIEW COMPARISON:  02/15/2015. FINDINGS: Heart size stable. Progressive right pleural effusion. Underlying right base atelectasis. Left lung is clear. No pneumothorax. No acute osseous abnormality. IMPRESSION: Progressive large right pleural effusion.  Right base atelectasis. Electronically Signed   By: Maisie Fushomas  Register   On: 02/17/2015 07:30   Dg Chest Port 1 View  02/15/2015  CLINICAL DATA:  Patient status post right-sided thoracentesis. Shortness of breath. Worsening cough. EXAM: PORTABLE CHEST 1 VIEW COMPARISON:  Chest radiograph earlier same day. FINDINGS: Monitoring leads overlie the patient. Stable cardiac and mediastinal contours. Unchanged moderate right-sided pleural effusion, layering with underlying consolidative opacities within the right hemi thorax. Left lung is clear. No pneumothorax. IMPRESSION: Layering moderate right pleural effusion with underlying pulmonary consolidation most suggestive of atelectasis. No large pneumothorax identified. Electronically Signed   By: Annia Beltrew  Davis M.D.   On:  02/15/2015 18:49   Dg Chest Port 1 View  02/14/2015  CLINICAL DATA:  55 year old female with history of pleural effusion. Increased work of breathing. History of COPD, asthma, hypertension and ascites. EXAM: PORTABLE CHEST 1 VIEW COMPARISON:  Chest x-ray 02/09/2015. FINDINGS: Further enlargement of a large right pleural effusion with extensive passive atelectasis throughout much of the right lung. Decreasing residual aerated regions in the right upper lobe indicative of further atelectasis compared to the recent prior examination. Left lung is clear. No left pleural effusion. No evidence of pulmonary edema. No pneumothorax. Cardiac silhouette is partially obscured, but appears within normal limits in size. IMPRESSION: 1. Continued increase in size of large right pleural effusion with worsening passive atelectasis in the right lung.  Electronically Signed   By: Trudie Reed M.D.   On: 02/14/2015 15:40   Dg Chest Port 1 View  02/11/2015  CLINICAL DATA:  Post right thoracentesis EXAM: PORTABLE CHEST 1 VIEW COMPARISON:  02/11/2015 FINDINGS: Large right pleural effusion, decreasing since prior study following thoracentesis. No pneumothorax. Improved aeration in the right upper lobe with continued probable atelectasis throughout much of the right lung. No focal opacity on the left. Heart is normal size. No acute bony abnormality. IMPRESSION: Large right pleural effusion, decreasing following thoracentesis with improved aeration in the right upper lobe. No pneumothorax. Electronically Signed   By: Charlett Nose M.D.   On: 02/11/2015 18:31   Dg Chest Port 1 View  02/11/2015  CLINICAL DATA:  Shortness of breath for 3 days EXAM: PORTABLE CHEST 1 VIEW COMPARISON:  January 28, 2014 FINDINGS: There is diffuse opacification of the right hemithorax. The left lung is clear. Heart size is normal. Pulmonary vascularity on the left is normal. Pulmonary vascularity on the right is obscured. There is no adenopathy in  regions which can be assessed. IMPRESSION: Diffuse opacity of the right hemithorax. Suspect large pleural effusion with likely compressive atelectasis. Underlying right lung mass/and or consolidation cannot be excluded radiographically. Left lung clear. Cardiac silhouette within normal limits. Electronically Signed   By: Bretta Bang III M.D.   On: 02/11/2015 14:46   US Thoracentesis Asp Pleural Space W/img Guide  02/15/2015  INDICATION: Symptomatic Right sided pleural effusion EXAM: US THORACENTESIS ASP PLEURAL SPACE W/IMG GUIDE COMPARISON:  None MEDICATIONS: 10 cc 1% lidocaine COMPLICATIONS: None immediate TECHNIQUE: Informed written consent was obtained from the patient after a discussion of the risks, benefits and alternatives to treatment. A timeout was performed prior to the initiation of the procedure. Initial ultrasound scanning demonstrates a Right pleural effusion. The lower chest was prepped and draped in the usual sterile fashion. 1% lidocaine was used for local anesthesia. Under direct ultrasound guidance, a 19 gauge, 7-cm, Yueh catheter was introduced. An ultrasound image was saved for documentation purposes.The thoracentesis was performed. The catheter was removed and a dressing was applied. The patient tolerated the procedure well without immediate post procedural complication. The patient was escorted to have an upright chest radiograph. FINDINGS: A total of approximately 1.7 liters of serous fluid was removed. Requested samples were sent to the laboratory. IMPRESSION: Successful ultrasound-guided R sided thoracentesis yielding 1.7 liters of pleural fluid. Read by: Robet Leu [facility] Electronically Signed   By: Irish Lack M.D.   On: 02/15/2015 10:26    Microbiology: Recent Results (from the past 240 hour(s))  Body fluid culture     Status: None   Collection Time: 02/11/15  7:35 PM  Result Value Ref Range Status   Specimen Description PLEURAL RIGHT FLUID  Final   Special  Requests NONE  Final   Gram Stain   Final    RARE WBC PRESENT,BOTH PMN AND MONONUCLEAR NO ORGANISMS SEEN    Culture NO GROWTH 3 DAYS  Final   Report Status 02/15/2015 FINAL  Final  Urine culture     Status: None   Collection Time: 02/12/15 11:53 AM  Result Value Ref Range Status   Specimen Description URINE, RANDOM  Final   Special Requests NONE  Final   Culture MULTIPLE SPECIES PRESENT, SUGGEST RECOLLECTION  Final   Report Status 02/13/2015 FINAL  Final  Culture, body fluid-bottle     Status: None (Preliminary result)   Collection Time: 02/15/15 10:06 AM  Result Value Ref  Range Status   Specimen Description FLUID PLEURAL RIGHT  Final   Special Requests NONE  Final   Culture NO GROWTH 3 DAYS  Final   Report Status PENDING  Incomplete  Gram stain     Status: None   Collection Time: 02/15/15 10:06 AM  Result Value Ref Range Status   Specimen Description FLUID PLEURAL RIGHT  Final   Special Requests NONE  Final   Gram Stain   Final    RARE WBC PRESENT, PREDOMINANTLY MONONUCLEAR NO ORGANISMS SEEN    Report Status 02/15/2015 FINAL  Final  MRSA PCR Screening     Status: None   Collection Time: 02/17/15  1:54 PM  Result Value Ref Range Status   MRSA by PCR NEGATIVE NEGATIVE Final    Comment:        The GeneXpert MRSA Assay (FDA approved for NASAL specimens only), is one component of a comprehensive MRSA colonization surveillance program. It is not intended to diagnose MRSA infection nor to guide or monitor treatment for MRSA infections.      Labs: Basic Metabolic Panel:  Recent Labs Lab 02/13/15 0915 02/14/15 0900 02/17/15 0240 02/19/15 0235  NA 131* 134* 136 136  K 3.7 3.7 3.0* 3.3*  CL 100* 99* 98* 99*  CO2 22 26 28 27   GLUCOSE 204* 150* 162* 102*  BUN 18 23* 10 8  CREATININE 1.39* 1.43* 1.12* 0.98  CALCIUM 8.8* 8.8* 8.6* 9.1  MG 2.0  --   --   --    Liver Function Tests:  Recent Labs Lab 02/13/15 0915 02/17/15 0240 02/19/15 0235  AST 65* 50* 55*   ALT 22 20 20   ALKPHOS 91 88 99  BILITOT 3.8* 4.1* 3.7*  PROT 6.6 6.1* 6.8  ALBUMIN 2.7* 2.5* 2.5*   No results for input(s): LIPASE, AMYLASE in the last 168 hours.  Recent Labs Lab 02/13/15 0510 02/17/15 0315  AMMONIA 91* 75*   CBC:  Recent Labs Lab 02/17/15 0240 02/19/15 0235  WBC 6.3 6.9  HGB 10.6* 11.1*  HCT 31.5* 33.3*  MCV 98.4 98.5  PLT 77* 95*   Cardiac Enzymes: No results for input(s): CKTOTAL, CKMB, CKMBINDEX, TROPONINI in the last 168 hours. BNP: BNP (last 3 results)  Recent Labs  02/11/15 1442  BNP 51.4    ProBNP (last 3 results) No results for input(s): PROBNP in the last 8760 hours.  CBG: No results for input(s): GLUCAP in the last 168 hours.     Signed:  Carolyne Littles, MD [facility] [phone] pager

## 2015-02-19 NOTE — Progress Notes (Signed)
Have alerted donna w [facility] of poss dc today. Port o2 in room and [facility] rep to come back by and go over use again. Home concen will be del when gets home and they will go over that eq in hope.

## 2015-02-19 NOTE — Progress Notes (Signed)
Physical Therapy Treatment Patient Details Name: Melissa Stout A Senters MRN: [id number] DOB: 04/24/1960 Today's Date: 02/19/2015    History of Present Illness 55 yo female admitted with R pleural effusion.  PMH positive for Depression, HTN, COPD, GERD, prediabetes, ascites and cirrhosis.    PT Comments    Patient continues with dyspnea on exertion and will need to utilize supplemental O2 at home in order to improve activity tolerance for functional ADL's.  Agree with HHPT follow up for continued education, safety and progression of endurance as able.  Follow Up Recommendations  Home health PT;Supervision/Assistance - 24 hour     Equipment Recommendations  Other (comment) (O2)    Recommendations for Other Services       Precautions / Restrictions Precautions Precautions: Fall    Mobility  Bed Mobility Overal bed mobility: Modified Independent                Transfers Overall transfer level: Independent                  Ambulation/Gait Ambulation/Gait assistance: Supervision Ambulation Distance (Feet): 300 Feet (one seated and one standing rest due to dyspnea) Assistive device: None Gait Pattern/deviations: Step-through pattern;Decreased stride length     General Gait Details: no physical help except suggested she sit to rest due to increasing dyspnea, states due to not doing anything for two weeks   Stairs            Wheelchair Mobility    Modified Rankin (Stroke Patients Only)       Balance             Standing balance-Leahy Scale: Good                      Cognition Arousal/Alertness: Awake/alert Behavior During Therapy: WFL for tasks assessed/performed Overall Cognitive Status: Within Functional Limits for tasks assessed                      Exercises      General Comments General comments (skin integrity, edema, etc.): SpO2 not monitored during ambulation, but pt symptomatic with SOB and cues for resting when feeling  dyspneic, had O2 in the room and though refused it for ambulation education pt needs to use at home to improve activity tolerance      Pertinent Vitals/Pain Pain Assessment: No/denies pain    Home Living                      Prior Function            PT Goals (current goals can now be found in the care plan section) Progress towards PT goals: Progressing toward goals    Frequency  Min 3X/week    PT Plan Current plan remains appropriate    Co-evaluation             End of Session Equipment Utilized During Treatment: Gait belt Activity Tolerance: Treatment limited secondary to medical complications (Comment) (SOB) Patient left: in bed;with call bell/phone within reach     Time: 1045-1059 PT Time Calculation (min) (ACUTE ONLY): 14 min  Charges:  $Gait Training: 8-22 mins                    G Codes:      Elray McgregorCynthia Sharlene Mccluskey 02/19/2015, 12:14 PM Sheran Lawlessyndi Skarlett Sedlacek, PT [phone] 02/19/2015

## 2015-02-20 ENCOUNTER — Encounter: Payer: Self-pay | Admitting: *Deleted

## 2015-02-20 ENCOUNTER — Other Ambulatory Visit: Payer: Self-pay

## 2015-02-20 ENCOUNTER — Telehealth: Payer: Self-pay | Admitting: Pulmonary Disease

## 2015-02-20 DIAGNOSIS — Z7982 Long term (current) use of aspirin: Secondary | ICD-10-CM | POA: Diagnosis not present

## 2015-02-20 DIAGNOSIS — I5032 Chronic diastolic (congestive) heart failure: Secondary | ICD-10-CM | POA: Diagnosis not present

## 2015-02-20 DIAGNOSIS — K219 Gastro-esophageal reflux disease without esophagitis: Secondary | ICD-10-CM | POA: Diagnosis not present

## 2015-02-20 DIAGNOSIS — I110 Hypertensive heart disease with heart failure: Secondary | ICD-10-CM | POA: Diagnosis not present

## 2015-02-20 DIAGNOSIS — Z7951 Long term (current) use of inhaled steroids: Secondary | ICD-10-CM | POA: Diagnosis not present

## 2015-02-20 DIAGNOSIS — J45909 Unspecified asthma, uncomplicated: Secondary | ICD-10-CM | POA: Diagnosis not present

## 2015-02-20 DIAGNOSIS — J449 Chronic obstructive pulmonary disease, unspecified: Secondary | ICD-10-CM | POA: Diagnosis not present

## 2015-02-20 DIAGNOSIS — F419 Anxiety disorder, unspecified: Secondary | ICD-10-CM | POA: Diagnosis not present

## 2015-02-20 DIAGNOSIS — K7031 Alcoholic cirrhosis of liver with ascites: Secondary | ICD-10-CM | POA: Diagnosis not present

## 2015-02-20 LAB — CULTURE, BODY FLUID W GRAM STAIN -BOTTLE: Culture: NO GROWTH

## 2015-02-20 NOTE — Patient Outreach (Signed)
[facility](THN) Care Management  02/20/2015  Melissa Stout 09/25/1960 [id number]   Assessment: 55 year old discharged yesterday from inpatient setting. Member reports she is feeling better. Reviewed medications. Member reports she has her medications except Rifaximin and wanted to know "what it was for". RNCM informed member that Rifaximin was an antibiotic and discussed the importance of getting her medication as soon as possible today reminding member of inclement weather.  Member states that she will get it today. Member reports uncontrollable itching that kept her awake last night. Member stated she is using hydrocortisone cream and states she has used about a whole bottle. RNCM informed member it is possible to get too much hydrocortisone and encouraged member to contact her doctor to discuss. Member reports that she was sent home on oxygen. She is not able to state the flow of the oxygen.   Member is active with home health. Home health has contacted member and is coming to see her this morning at 10am. Member is to discuss with home health nurse, itching, how to use oxygen tank, review  Medications.    Member to call primary care regarding itching and schedule a follow up appointment.  Follow up visit schedule with Dr. Dulce Sellarutlaw on Monday. Member reports her primary care physician is Dr. Lanier EnsignSteven Meyers.  Plan: update assigned care coordinator. Member will received transition of care call next week.  Kathyrn SheriffJuana Sabrinia Prien, RN, MSN, [facility],CCM [facility] Community Care Coordinator Cell: [phone]

## 2015-02-20 NOTE — Telephone Encounter (Signed)
atc pt X3, fast busy signal.   lmtcb on mobile number X2.

## 2015-02-20 NOTE — Telephone Encounter (Signed)
Attempted to call Samantha back, line was busy, will call back.

## 2015-02-23 DIAGNOSIS — K7030 Alcoholic cirrhosis of liver without ascites: Secondary | ICD-10-CM | POA: Diagnosis not present

## 2015-02-23 DIAGNOSIS — J948 Other specified pleural conditions: Secondary | ICD-10-CM | POA: Diagnosis not present

## 2015-02-23 DIAGNOSIS — R188 Other ascites: Secondary | ICD-10-CM | POA: Diagnosis not present

## 2015-02-23 NOTE — Telephone Encounter (Signed)
Pt cb °

## 2015-02-23 NOTE — Telephone Encounter (Signed)
[phone] or [phone], pt cb

## 2015-02-23 NOTE — Telephone Encounter (Signed)
Spoke with pt, states she is about to leave for a doctor's appt and cannot schedule her HFU at this time.  Will call back to schedule HFU with TP next available.

## 2015-02-23 NOTE — Telephone Encounter (Signed)
atc [facility] in an [facility] to [facility] as the number below is the pt's number and not Samantha's. Call answering service answered the call and placed a message for [facility]. Will await call back.

## 2015-02-24 DIAGNOSIS — J449 Chronic obstructive pulmonary disease, unspecified: Secondary | ICD-10-CM | POA: Diagnosis not present

## 2015-02-24 DIAGNOSIS — Z7951 Long term (current) use of inhaled steroids: Secondary | ICD-10-CM | POA: Diagnosis not present

## 2015-02-24 DIAGNOSIS — K7031 Alcoholic cirrhosis of liver with ascites: Secondary | ICD-10-CM | POA: Diagnosis not present

## 2015-02-24 DIAGNOSIS — I110 Hypertensive heart disease with heart failure: Secondary | ICD-10-CM | POA: Diagnosis not present

## 2015-02-24 DIAGNOSIS — I5032 Chronic diastolic (congestive) heart failure: Secondary | ICD-10-CM | POA: Diagnosis not present

## 2015-02-24 DIAGNOSIS — J45909 Unspecified asthma, uncomplicated: Secondary | ICD-10-CM | POA: Diagnosis not present

## 2015-02-24 DIAGNOSIS — Z7982 Long term (current) use of aspirin: Secondary | ICD-10-CM | POA: Diagnosis not present

## 2015-02-24 DIAGNOSIS — F419 Anxiety disorder, unspecified: Secondary | ICD-10-CM | POA: Diagnosis not present

## 2015-02-24 DIAGNOSIS — K219 Gastro-esophageal reflux disease without esophagitis: Secondary | ICD-10-CM | POA: Diagnosis not present

## 2015-02-24 NOTE — Telephone Encounter (Signed)
I called the main [facility] number, [phone]... Will call back.

## 2015-02-24 NOTE — Telephone Encounter (Signed)
What telephone number did you use to contact Samantha? Please advise.

## 2015-02-24 NOTE — Telephone Encounter (Signed)
This pt has never been seen in our practice, the medication that requires a PA is given for IBS, and was prescribed by a doctor not in our practice.   Called [facility] at their main number listed below, was forwarded to referral support team, was put on hold for 15 minutes.  Wcb.

## 2015-02-24 NOTE — Telephone Encounter (Signed)
Pt cb, [phone] or [phone], states she is suppose to be seen sooner to get fluid drawn off lungs, I schedule appt for hospial f/u w/ TP 03/11/15 at 11:30am, she states per hospital it needed to be sooner that that because she has fluid on lungs, please advise

## 2015-02-24 NOTE — Telephone Encounter (Signed)
lmtcb X1 for pt. BQ please advise if pt needs to be seen before 03/11/15.  If so please advise if pt can be worked in your schedule somewhere, as this is the next available hfu for TP.  Thanks!

## 2015-02-25 ENCOUNTER — Other Ambulatory Visit: Payer: Self-pay | Admitting: *Deleted

## 2015-02-25 NOTE — Telephone Encounter (Signed)
Spoke with pt. Advised her of this information. States that she spoke with her PCP was told that she did not need this medication. Nothing further was needed.

## 2015-02-25 NOTE — Telephone Encounter (Signed)
Acute visit

## 2015-02-25 NOTE — Patient Outreach (Signed)
[facility](THN) Care Management  02/25/2015  Ala BentLou A Hansley 12/24/1960 [id number]  Transition of care (week 2)  RN attempted outreach call today however mother indicates pt "sleeping" and requested RN to call back. RN will continue to follow up on pt's care and ongoing recovery related to recent discharge.  Elliot CousinLisa Cintia Gleed, RN Care Management Coordinator [facility] Main Office [phone]

## 2015-02-26 NOTE — Telephone Encounter (Signed)
Pt scheduled for HFU 03/02/15 at 130 Aware to arrive about 15-5420mins early for CXR.  Nothing further needed.

## 2015-02-27 ENCOUNTER — Other Ambulatory Visit (HOSPITAL_COMMUNITY): Payer: Self-pay | Admitting: Gastroenterology

## 2015-02-27 ENCOUNTER — Other Ambulatory Visit: Payer: Self-pay | Admitting: *Deleted

## 2015-02-27 DIAGNOSIS — R188 Other ascites: Secondary | ICD-10-CM

## 2015-02-27 DIAGNOSIS — I5032 Chronic diastolic (congestive) heart failure: Secondary | ICD-10-CM | POA: Diagnosis not present

## 2015-02-27 DIAGNOSIS — K219 Gastro-esophageal reflux disease without esophagitis: Secondary | ICD-10-CM | POA: Diagnosis not present

## 2015-02-27 DIAGNOSIS — K7031 Alcoholic cirrhosis of liver with ascites: Secondary | ICD-10-CM | POA: Diagnosis not present

## 2015-02-27 DIAGNOSIS — Z7982 Long term (current) use of aspirin: Secondary | ICD-10-CM | POA: Diagnosis not present

## 2015-02-27 DIAGNOSIS — J45909 Unspecified asthma, uncomplicated: Secondary | ICD-10-CM | POA: Diagnosis not present

## 2015-02-27 DIAGNOSIS — J948 Other specified pleural conditions: Secondary | ICD-10-CM

## 2015-02-27 DIAGNOSIS — Z7951 Long term (current) use of inhaled steroids: Secondary | ICD-10-CM | POA: Diagnosis not present

## 2015-02-27 DIAGNOSIS — I110 Hypertensive heart disease with heart failure: Secondary | ICD-10-CM | POA: Diagnosis not present

## 2015-02-27 DIAGNOSIS — J449 Chronic obstructive pulmonary disease, unspecified: Secondary | ICD-10-CM | POA: Diagnosis not present

## 2015-02-27 DIAGNOSIS — F419 Anxiety disorder, unspecified: Secondary | ICD-10-CM | POA: Diagnosis not present

## 2015-02-27 NOTE — Patient Outreach (Signed)
[facility]N) Care Management  02/27/2015  Melissa BentLou A Stout 07/20/1960 [id number]  Transition of care (week 2)  RN was able to speak with the pt today who indicates she is doing much better. RN reintroduced [facility] and purpose of today's call. Pt reports [facility] remains involved with 2 visits this week and she did not fill the antibiotics (to expensive) indicating her GI doctor placed her on lactulose as pt reports " to get rid of the pneumonia" in her system. Pt also states she did not follow up with her primary. RN stress the importance of post-op follow up with her primary as pt will follow up on Monday and inquire if she needs to make an appointment. RN inquired on pt's knowledge base related to HF which was very limited. RN educated pt on the HF zones and verified that she is currently in the GREEN zone with a weight yesterday 154 lbs and today 151 lbs with no swelling. RN offered face-to-face education on her medical condition as pt very receptive to community home visits. [place]. RN will follow up next week for scheduling a home visit with pt for [facility] services. Pt very grateful for the follow up call today.  Elliot CousinLisa Lamarco Gudiel, RN Care Management Coordinator [facility] Main Office [phone]

## 2015-03-02 ENCOUNTER — Other Ambulatory Visit: Payer: Self-pay | Admitting: *Deleted

## 2015-03-02 ENCOUNTER — Encounter: Payer: Self-pay | Admitting: Pulmonary Disease

## 2015-03-02 ENCOUNTER — Ambulatory Visit (INDEPENDENT_AMBULATORY_CARE_PROVIDER_SITE_OTHER): Payer: Commercial Managed Care - HMO | Admitting: Pulmonary Disease

## 2015-03-02 VITALS — BP 102/62 | HR 68 | Ht 65.0 in | Wt 149.0 lb

## 2015-03-02 DIAGNOSIS — J90 Pleural effusion, not elsewhere classified: Secondary | ICD-10-CM | POA: Diagnosis not present

## 2015-03-02 DIAGNOSIS — Z720 Tobacco use: Secondary | ICD-10-CM

## 2015-03-02 NOTE — Patient Instructions (Signed)
Follow up as needed

## 2015-03-02 NOTE — Progress Notes (Signed)
Current Outpatient Prescriptions on File Prior to Visit  Medication Sig  . albuterol (PROAIR HFA) 108 (90 BASE) MCG/ACT inhaler Inhale 2 puffs into the lungs every 6 (six) hours as needed for wheezing or shortness of breath.  . ALPRAZolam (XANAX) 0.5 MG tablet Take 0.5 mg by mouth 2 (two) times daily as needed for anxiety.   Marland Kitchen. aspirin EC 81 MG tablet Take 81 mg by mouth at bedtime.  . furosemide (LASIX) 20 MG tablet Take 4 tablets (80 mg total) by mouth 2 (two) times daily.  Marland Kitchen. lactulose (CHRONULAC) 10 GM/15ML solution Take 45 mLs (30 g total) by mouth daily. Please titrate dose to have 2-3 Bowel Movement a day  . lactulose (CHRONULAC) 10 GM/15ML solution Take 45 mLs (30 g total) by mouth 2 (two) times daily.  . metoprolol tartrate (LOPRESSOR) 25 MG tablet Take 1 tablet (25 mg total) by mouth 2 (two) times daily.  . nicotine (NICODERM CQ - DOSED IN MG/24 HOURS) 14 mg/24hr patch Place 1 patch (14 mg total) onto the skin daily.  Marland Kitchen. omeprazole (PRILOSEC) 40 MG capsule Take 40 mg by mouth daily.  . Oxycodone HCl 10 MG TABS Take 10 mg by mouth every 6 (six) hours as needed (pain).   Marland Kitchen. spironolactone (ALDACTONE) 100 MG tablet Take 2 tablets (200 mg total) by mouth daily.  . rifaximin (XIFAXAN) 550 MG TABS tablet Take 1 tablet (550 mg total) by mouth 2 (two) times daily. (Patient not taking: Reported on 03/02/2015)   No current facility-administered medications on file prior to visit.     Chief Complaint  Patient presents with  . Hospitalization Follow-up    [facility] pleural [facility] and had 2 thoracentesis done. Pt is scheduled to have another done this week. Pt denies any increased SOB since being discharged. Noted more fatigue than normal.      Tests 12/28 Rt thoracentesis >> 1.5 liters, glucose 150, LDH 34, protein < 3, WBC 81, cytology negative 01/01 Rt thoracentesis >> 1.7 liters, glucose 142, LDH 40, protein < 3, WBC 84  Past medical hx Cirrhosis, HTN, Depression, Chronic  pain, GERD  Past surgical hx, Allergies, Family hx, Social hx all reviewed.  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  Her breathing has been okay.  She still smokes a few cigarettes per day.  She gets occasional cough.  She uses proair intermittently and this helps.  She has thoracentesis schedule for 03/05/15.  She has oxygen at home, but has not been using it.  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 Plan: - she will f/u with Dr. Dulce Sellarutlaw with GI to arrange for intermittent thoracentesis - continue aldactone, lasix  Chronic bronchitis with tobacco abuse. Plan: - smoking cessation - prn proair   Patient Instructions  Follow up as needed     Coralyn HellingVineet Charmain Diosdado, MD [place] [facility]/Critical [facility]/Sleep Pager:  [phone]

## 2015-03-03 DIAGNOSIS — K219 Gastro-esophageal reflux disease without esophagitis: Secondary | ICD-10-CM | POA: Diagnosis not present

## 2015-03-03 DIAGNOSIS — R0602 Shortness of breath: Secondary | ICD-10-CM | POA: Insufficient documentation

## 2015-03-03 DIAGNOSIS — I110 Hypertensive heart disease with heart failure: Secondary | ICD-10-CM | POA: Diagnosis not present

## 2015-03-03 DIAGNOSIS — J449 Chronic obstructive pulmonary disease, unspecified: Secondary | ICD-10-CM | POA: Diagnosis not present

## 2015-03-03 DIAGNOSIS — Z7951 Long term (current) use of inhaled steroids: Secondary | ICD-10-CM | POA: Diagnosis not present

## 2015-03-03 DIAGNOSIS — Z7982 Long term (current) use of aspirin: Secondary | ICD-10-CM | POA: Diagnosis not present

## 2015-03-03 DIAGNOSIS — F419 Anxiety disorder, unspecified: Secondary | ICD-10-CM | POA: Diagnosis not present

## 2015-03-03 DIAGNOSIS — I5032 Chronic diastolic (congestive) heart failure: Secondary | ICD-10-CM | POA: Diagnosis not present

## 2015-03-03 DIAGNOSIS — J45909 Unspecified asthma, uncomplicated: Secondary | ICD-10-CM | POA: Diagnosis not present

## 2015-03-03 DIAGNOSIS — K7031 Alcoholic cirrhosis of liver with ascites: Secondary | ICD-10-CM | POA: Diagnosis not present

## 2015-03-05 ENCOUNTER — Ambulatory Visit (HOSPITAL_COMMUNITY)
Admission: RE | Admit: 2015-03-05 | Discharge: 2015-03-05 | Disposition: A | Discharge status: Home / Self Care | Payer: Commercial Managed Care - HMO | Source: Ambulatory Visit | Attending: Gastroenterology | Admitting: Gastroenterology

## 2015-03-05 ENCOUNTER — Ambulatory Visit (HOSPITAL_COMMUNITY)
Admission: RE | Admit: 2015-03-05 | Discharge: 2015-03-05 | Disposition: A | Discharge status: Home / Self Care | Payer: Commercial Managed Care - HMO | Source: Ambulatory Visit | Attending: Radiology | Admitting: Radiology

## 2015-03-05 DIAGNOSIS — J90 Pleural effusion, not elsewhere classified: Secondary | ICD-10-CM | POA: Insufficient documentation

## 2015-03-05 DIAGNOSIS — J948 Other specified pleural conditions: Secondary | ICD-10-CM | POA: Insufficient documentation

## 2015-03-05 DIAGNOSIS — K7460 Unspecified cirrhosis of liver: Secondary | ICD-10-CM | POA: Insufficient documentation

## 2015-03-05 DIAGNOSIS — Z9889 Other specified postprocedural states: Secondary | ICD-10-CM | POA: Insufficient documentation

## 2015-03-05 DIAGNOSIS — R918 Other nonspecific abnormal finding of lung field: Secondary | ICD-10-CM | POA: Diagnosis not present

## 2015-03-05 NOTE — Procedures (Signed)
US guided therapeutic right thoracentesis performed yielding 475 cc yellow fluid. F/u CXR pending. No immediate complications.

## 2015-03-06 DIAGNOSIS — Z7951 Long term (current) use of inhaled steroids: Secondary | ICD-10-CM | POA: Diagnosis not present

## 2015-03-06 DIAGNOSIS — K7031 Alcoholic cirrhosis of liver with ascites: Secondary | ICD-10-CM | POA: Diagnosis not present

## 2015-03-06 DIAGNOSIS — K219 Gastro-esophageal reflux disease without esophagitis: Secondary | ICD-10-CM | POA: Diagnosis not present

## 2015-03-06 DIAGNOSIS — F419 Anxiety disorder, unspecified: Secondary | ICD-10-CM | POA: Diagnosis not present

## 2015-03-06 DIAGNOSIS — J449 Chronic obstructive pulmonary disease, unspecified: Secondary | ICD-10-CM | POA: Diagnosis not present

## 2015-03-06 DIAGNOSIS — Z7982 Long term (current) use of aspirin: Secondary | ICD-10-CM | POA: Diagnosis not present

## 2015-03-06 DIAGNOSIS — I5032 Chronic diastolic (congestive) heart failure: Secondary | ICD-10-CM | POA: Diagnosis not present

## 2015-03-06 DIAGNOSIS — J45909 Unspecified asthma, uncomplicated: Secondary | ICD-10-CM | POA: Diagnosis not present

## 2015-03-06 DIAGNOSIS — I110 Hypertensive heart disease with heart failure: Secondary | ICD-10-CM | POA: Diagnosis not present

## 2015-03-09 ENCOUNTER — Other Ambulatory Visit: Payer: Self-pay | Admitting: *Deleted

## 2015-03-09 NOTE — Patient Outreach (Signed)
Transition of care call #3 (we have transitioned from one care manager to myself) Pt states she is doing OK. She still has not seen her primary MD. She said she was told she didn't have to been seen there. I explained to her that it is IMPORTANT that she she Dr. Lenise Arena since she was in the hospital. She has seen her pulmonologist last week and has has an appt scheduled to see her GI specialist on on 2/25. She reports she does have all her medications now. She is weighing and today her weight was 149. She says she had only had a 2# variance. She tries to follow a low salt diet. She denies edema or SOB.  A:  S/P hospitalization for hypoxia and pleural effusion  P:  Pt to schedule appt with Dr. Lenise Arena.      I will see pt at her home on Wednesday, Jan. 25th at 12:30 pm.  [facility] Care Manager [phone]

## 2015-03-11 ENCOUNTER — Encounter: Payer: Self-pay | Admitting: *Deleted

## 2015-03-11 ENCOUNTER — Other Ambulatory Visit: Payer: Self-pay | Admitting: *Deleted

## 2015-03-11 ENCOUNTER — Inpatient Hospital Stay: Payer: Commercial Managed Care - HMO | Admitting: Adult Health

## 2015-03-11 DIAGNOSIS — K219 Gastro-esophageal reflux disease without esophagitis: Secondary | ICD-10-CM | POA: Diagnosis not present

## 2015-03-11 DIAGNOSIS — F419 Anxiety disorder, unspecified: Secondary | ICD-10-CM | POA: Diagnosis not present

## 2015-03-11 DIAGNOSIS — J449 Chronic obstructive pulmonary disease, unspecified: Secondary | ICD-10-CM | POA: Diagnosis not present

## 2015-03-11 DIAGNOSIS — J45909 Unspecified asthma, uncomplicated: Secondary | ICD-10-CM | POA: Diagnosis not present

## 2015-03-11 DIAGNOSIS — K7031 Alcoholic cirrhosis of liver with ascites: Secondary | ICD-10-CM | POA: Diagnosis not present

## 2015-03-11 DIAGNOSIS — Z7951 Long term (current) use of inhaled steroids: Secondary | ICD-10-CM | POA: Diagnosis not present

## 2015-03-11 DIAGNOSIS — Z7982 Long term (current) use of aspirin: Secondary | ICD-10-CM | POA: Diagnosis not present

## 2015-03-11 DIAGNOSIS — I5032 Chronic diastolic (congestive) heart failure: Secondary | ICD-10-CM | POA: Diagnosis not present

## 2015-03-11 DIAGNOSIS — I110 Hypertensive heart disease with heart failure: Secondary | ICD-10-CM | POA: Diagnosis not present

## 2015-03-11 NOTE — Patient Outreach (Signed)
[facility]) Care Management   03/11/2015  Melissa Stout 1960-06-24 [id number]  Melissa Stout is an 55 y.o. female  Subjective: Pt is s/p hospitalization for hypoxemia and pleural effusion. She has multiple comordities. She has a new dx of CHF and she denies having any education for same. She resides with her elderly mother. Pt has O2 but she has not ever used it and she wants it removed. She does have all her meds and is taking them per the discharge orders except her metoprolol was changed from succinate to tartrate and should be taken bid, which she is only taking it daily.  Objective:  BP 100/62 mmHg  Pulse 62  Resp 18  Wt 148 lb (67.132 kg)  SpO2 98%  Review of Systems  Constitutional: Negative.   HENT: Negative.   Eyes: Negative.   Respiratory: Negative.   Cardiovascular: Negative.   Gastrointestinal: Negative.   Genitourinary: Negative.   Musculoskeletal: Positive for back pain.       Hx of 3 back surgeries.  Skin: Negative.   Neurological: Negative.   Endo/Heme/Allergies: Bruises/bleeds easily.  Psychiatric/Behavioral: Negative.     Physical Exam  Constitutional: She is oriented to person, place, and time. She appears well-developed and well-nourished.  HENT:  Head: Normocephalic.  Cardiovascular: Normal rate, regular rhythm and normal heart sounds.   Respiratory: Effort normal and breath sounds normal.  Slightly diminished.  GI: Soft. Bowel sounds are normal.  Musculoskeletal: Normal range of motion.  Neurological: She is alert and oriented to person, place, and time.  Skin: Skin is warm and dry.  Psychiatric: She has a normal mood and affect.    Current Medications:   Current Outpatient Prescriptions  Medication Sig Dispense Refill  . albuterol (PROAIR HFA) 108 (90 BASE) MCG/ACT inhaler Inhale 2 puffs into the lungs every 6 (six) hours as needed for wheezing or shortness of breath.    . ALPRAZolam (XANAX) 0.5 MG tablet Take 0.5 mg by mouth 2  (two) times daily as needed for anxiety.     Marland Kitchen aspirin EC 81 MG tablet Take 81 mg by mouth at bedtime.    . furosemide (LASIX) 20 MG tablet Take 4 tablets (80 mg total) by mouth 2 (two) times daily. 240 tablet 0  . lactulose (CHRONULAC) 10 GM/15ML solution Take 45 mLs (30 g total) by mouth daily. Please titrate dose to have 2-3 Bowel Movement a day 1892 mL 0  . metoprolol tartrate (LOPRESSOR) 25 MG tablet Take 1 tablet (25 mg total) by mouth 2 (two) times daily. 60 tablet 0  . omeprazole (PRILOSEC) 40 MG capsule Take 40 mg by mouth daily.    . Oxycodone HCl 10 MG TABS Take 10 mg by mouth every 6 (six) hours as needed (pain).     Marland Kitchen lactulose (CHRONULAC) 10 GM/15ML solution Take 45 mLs (30 g total) by mouth 2 (two) times daily. 240 mL 0  . nicotine (NICODERM CQ - DOSED IN MG/24 HOURS) 14 mg/24hr patch Place 1 patch (14 mg total) onto the skin daily. (Patient not taking: Reported on 03/11/2015) 28 patch 0  . rifaximin (XIFAXAN) 550 MG TABS tablet Take 1 tablet (550 mg total) by mouth 2 (two) times daily. (Patient not taking: Reported on 03/02/2015) 60 tablet 0  . spironolactone (ALDACTONE) 100 MG tablet Take 2 tablets (200 mg total) by mouth daily. 60 tablet 0   No current facility-administered medications for this visit.    Functional Status:   In your  present state of health, do you have any difficulty performing the following activities: 02/14/2015 02/14/2015  Hearing? - N  Vision? - N  Difficulty concentrating or making decisions? - N  Walking or climbing stairs? - N  Dressing or bathing? - N  Doing errands, shopping? Y N    Fall/Depression Screening:    Depression screen [facility] 2/9 03/11/2015  Decreased Interest 1  Down, Depressed, Hopeless 1  PHQ - 2 Score 2  Altered sleeping 1  Tired, decreased energy 3  Change in appetite 0  Feeling bad or failure about yourself  0  Trouble concentrating 0  Moving slowly or fidgety/restless 0  Suicidal thoughts 0  PHQ-9 Score 6  Difficult doing  work/chores Not difficult at all    Fall Risk  03/11/2015  Falls in the past year? No  Risk for fall due to : Impaired balance/gait   Assessment:  S/P hospitalization for hypoxia, plueral effusion, CHF, cirrhosis  Plan:  Investigate if we can help pt get Rifaximin.            Needs incentive spirometer.            Ask Dr. Olen Pel if she can have the oxygen picked up since she doesn't use it.            HF education provided.            Advised pt she should take the metoprolol tartrate 25 mg BID.  THN CM Care Plan Problem One        Most Recent Value   Care Plan Problem One  at risk for readmission   Role Documenting the Problem One  Care Management [place] for Problem One  Active   THN Long Term Goal (31-90 days)  member will not be hospitalized within the next 31 days.   THN Long Term Goal Start Date  02/20/15   Interventions for Problem One Long Term Goal  Discussed pt chronic illnesses and provided CHF education. Emphasized calling NP or MD if problems arise early for early intervention.   THN CM Short Term Goal #1 (0-30 days)  member will schedule follow up appointment within the next 1-2 weeks.   THN CM Short Term Goal #1 Start Date  02/20/15   [facility] CM Short Term Goal #1 Met Date  03/11/15   Interventions for Short Term Goal #1  Pt to see her primary care MD tomorrow.   THN CM Short Term Goal #2 (0-30 days)  member will verbalize understanding of how to use oxygen tank within the next 30 days.   THN CM Short Term Goal #2 Start Date  02/20/15   Interventions for Short Term Goal #2  Pt has not used O2 and wants it removed from her home.    THN CM Care Plan Problem Two        Most Recent Value   Care Plan Problem Two  New diagnosis of CHF.   Role Documenting the Problem Two  Care Management [place] for Problem Two  Active   Interventions for Problem Two Long Term Goal   Provided CHF education packet and went over daily self maintenace, CHF Action  Plan, low sodium diet.   THN Long Term Goal (31-90) days  Pt will not be readmitted for CHF in the next 90 days. [Pt will not be readmittted for CHF in the next 66 days.]   THN Long Term Goal Start Date  03/11/15   THN CM Short Term Goal #1 (0-30 days)  Pt will call NP if weight goes up 3# in a day or 5# over several days.   THN CM Short Term Goal #1 Start Date  03/11/15   Interventions for Short Term Goal #2   Reinforced my role as her advocate for any problems and ease to contact.   THN CM Short Term Goal #2 (0-30 days)  Pt will begin weighing daily and recording her weight in the [facility] planner for the next 30 days.   THN CM Short Term Goal #2 Start Date  03/11/15   Interventions for Short Term Goal #2  Provided [facility] planner, demonstrated the CHF section, CHF Action Plan and weight record.     Deloria Lair [facility] [place] [phone]

## 2015-03-12 DIAGNOSIS — I5030 Unspecified diastolic (congestive) heart failure: Secondary | ICD-10-CM | POA: Diagnosis not present

## 2015-03-12 DIAGNOSIS — F329 Major depressive disorder, single episode, unspecified: Secondary | ICD-10-CM | POA: Diagnosis not present

## 2015-03-12 DIAGNOSIS — I10 Essential (primary) hypertension: Secondary | ICD-10-CM | POA: Diagnosis not present

## 2015-03-12 DIAGNOSIS — M545 Low back pain: Secondary | ICD-10-CM | POA: Diagnosis not present

## 2015-03-12 DIAGNOSIS — K7460 Unspecified cirrhosis of liver: Secondary | ICD-10-CM | POA: Diagnosis not present

## 2015-03-13 DIAGNOSIS — I110 Hypertensive heart disease with heart failure: Secondary | ICD-10-CM | POA: Diagnosis not present

## 2015-03-13 DIAGNOSIS — F419 Anxiety disorder, unspecified: Secondary | ICD-10-CM | POA: Diagnosis not present

## 2015-03-13 DIAGNOSIS — Z7982 Long term (current) use of aspirin: Secondary | ICD-10-CM | POA: Diagnosis not present

## 2015-03-13 DIAGNOSIS — I5032 Chronic diastolic (congestive) heart failure: Secondary | ICD-10-CM | POA: Diagnosis not present

## 2015-03-13 DIAGNOSIS — J449 Chronic obstructive pulmonary disease, unspecified: Secondary | ICD-10-CM | POA: Diagnosis not present

## 2015-03-13 DIAGNOSIS — J45909 Unspecified asthma, uncomplicated: Secondary | ICD-10-CM | POA: Diagnosis not present

## 2015-03-13 DIAGNOSIS — K219 Gastro-esophageal reflux disease without esophagitis: Secondary | ICD-10-CM | POA: Diagnosis not present

## 2015-03-13 DIAGNOSIS — K7031 Alcoholic cirrhosis of liver with ascites: Secondary | ICD-10-CM | POA: Diagnosis not present

## 2015-03-13 DIAGNOSIS — Z7951 Long term (current) use of inhaled steroids: Secondary | ICD-10-CM | POA: Diagnosis not present

## 2015-03-18 DIAGNOSIS — F419 Anxiety disorder, unspecified: Secondary | ICD-10-CM | POA: Diagnosis not present

## 2015-03-18 DIAGNOSIS — Z7982 Long term (current) use of aspirin: Secondary | ICD-10-CM | POA: Diagnosis not present

## 2015-03-18 DIAGNOSIS — K219 Gastro-esophageal reflux disease without esophagitis: Secondary | ICD-10-CM | POA: Diagnosis not present

## 2015-03-18 DIAGNOSIS — J45909 Unspecified asthma, uncomplicated: Secondary | ICD-10-CM | POA: Diagnosis not present

## 2015-03-18 DIAGNOSIS — Z7951 Long term (current) use of inhaled steroids: Secondary | ICD-10-CM | POA: Diagnosis not present

## 2015-03-18 DIAGNOSIS — K7031 Alcoholic cirrhosis of liver with ascites: Secondary | ICD-10-CM | POA: Diagnosis not present

## 2015-03-18 DIAGNOSIS — J449 Chronic obstructive pulmonary disease, unspecified: Secondary | ICD-10-CM | POA: Diagnosis not present

## 2015-03-18 DIAGNOSIS — I110 Hypertensive heart disease with heart failure: Secondary | ICD-10-CM | POA: Diagnosis not present

## 2015-03-18 DIAGNOSIS — I5032 Chronic diastolic (congestive) heart failure: Secondary | ICD-10-CM | POA: Diagnosis not present

## 2015-03-19 DIAGNOSIS — K7031 Alcoholic cirrhosis of liver with ascites: Secondary | ICD-10-CM | POA: Diagnosis not present

## 2015-03-19 DIAGNOSIS — J45909 Unspecified asthma, uncomplicated: Secondary | ICD-10-CM | POA: Diagnosis not present

## 2015-03-19 DIAGNOSIS — J449 Chronic obstructive pulmonary disease, unspecified: Secondary | ICD-10-CM | POA: Diagnosis not present

## 2015-03-19 DIAGNOSIS — Z7951 Long term (current) use of inhaled steroids: Secondary | ICD-10-CM | POA: Diagnosis not present

## 2015-03-19 DIAGNOSIS — K219 Gastro-esophageal reflux disease without esophagitis: Secondary | ICD-10-CM | POA: Diagnosis not present

## 2015-03-19 DIAGNOSIS — F419 Anxiety disorder, unspecified: Secondary | ICD-10-CM | POA: Diagnosis not present

## 2015-03-19 DIAGNOSIS — I110 Hypertensive heart disease with heart failure: Secondary | ICD-10-CM | POA: Diagnosis not present

## 2015-03-19 DIAGNOSIS — I5032 Chronic diastolic (congestive) heart failure: Secondary | ICD-10-CM | POA: Diagnosis not present

## 2015-03-19 DIAGNOSIS — Z7982 Long term (current) use of aspirin: Secondary | ICD-10-CM | POA: Diagnosis not present

## 2015-03-20 ENCOUNTER — Other Ambulatory Visit: Payer: Self-pay | Admitting: Gastroenterology

## 2015-03-20 DIAGNOSIS — R188 Other ascites: Secondary | ICD-10-CM | POA: Diagnosis not present

## 2015-03-20 DIAGNOSIS — K7460 Unspecified cirrhosis of liver: Secondary | ICD-10-CM

## 2015-03-20 DIAGNOSIS — K7030 Alcoholic cirrhosis of liver without ascites: Secondary | ICD-10-CM | POA: Diagnosis not present

## 2015-03-20 DIAGNOSIS — K769 Liver disease, unspecified: Secondary | ICD-10-CM | POA: Diagnosis not present

## 2015-03-26 DIAGNOSIS — I5032 Chronic diastolic (congestive) heart failure: Secondary | ICD-10-CM | POA: Diagnosis not present

## 2015-03-26 DIAGNOSIS — K219 Gastro-esophageal reflux disease without esophagitis: Secondary | ICD-10-CM | POA: Diagnosis not present

## 2015-03-26 DIAGNOSIS — J45909 Unspecified asthma, uncomplicated: Secondary | ICD-10-CM | POA: Diagnosis not present

## 2015-03-26 DIAGNOSIS — J449 Chronic obstructive pulmonary disease, unspecified: Secondary | ICD-10-CM | POA: Diagnosis not present

## 2015-03-26 DIAGNOSIS — F419 Anxiety disorder, unspecified: Secondary | ICD-10-CM | POA: Diagnosis not present

## 2015-03-26 DIAGNOSIS — Z7982 Long term (current) use of aspirin: Secondary | ICD-10-CM | POA: Diagnosis not present

## 2015-03-26 DIAGNOSIS — I110 Hypertensive heart disease with heart failure: Secondary | ICD-10-CM | POA: Diagnosis not present

## 2015-03-26 DIAGNOSIS — Z7951 Long term (current) use of inhaled steroids: Secondary | ICD-10-CM | POA: Diagnosis not present

## 2015-03-26 DIAGNOSIS — K7031 Alcoholic cirrhosis of liver with ascites: Secondary | ICD-10-CM | POA: Diagnosis not present

## 2015-03-27 DIAGNOSIS — J449 Chronic obstructive pulmonary disease, unspecified: Secondary | ICD-10-CM | POA: Diagnosis not present

## 2015-03-27 DIAGNOSIS — I5032 Chronic diastolic (congestive) heart failure: Secondary | ICD-10-CM | POA: Diagnosis not present

## 2015-03-27 DIAGNOSIS — K7031 Alcoholic cirrhosis of liver with ascites: Secondary | ICD-10-CM | POA: Diagnosis not present

## 2015-03-27 DIAGNOSIS — F419 Anxiety disorder, unspecified: Secondary | ICD-10-CM | POA: Diagnosis not present

## 2015-03-27 DIAGNOSIS — Z7982 Long term (current) use of aspirin: Secondary | ICD-10-CM | POA: Diagnosis not present

## 2015-03-27 DIAGNOSIS — J45909 Unspecified asthma, uncomplicated: Secondary | ICD-10-CM | POA: Diagnosis not present

## 2015-03-27 DIAGNOSIS — I110 Hypertensive heart disease with heart failure: Secondary | ICD-10-CM | POA: Diagnosis not present

## 2015-03-27 DIAGNOSIS — Z7951 Long term (current) use of inhaled steroids: Secondary | ICD-10-CM | POA: Diagnosis not present

## 2015-03-27 DIAGNOSIS — K219 Gastro-esophageal reflux disease without esophagitis: Secondary | ICD-10-CM | POA: Diagnosis not present

## 2015-03-30 DIAGNOSIS — R5383 Other fatigue: Secondary | ICD-10-CM | POA: Diagnosis not present

## 2015-03-30 DIAGNOSIS — K7030 Alcoholic cirrhosis of liver without ascites: Secondary | ICD-10-CM | POA: Diagnosis not present

## 2015-03-30 DIAGNOSIS — I959 Hypotension, unspecified: Secondary | ICD-10-CM | POA: Diagnosis not present

## 2015-03-31 ENCOUNTER — Ambulatory Visit
Admission: RE | Admit: 2015-03-31 | Discharge: 2015-03-31 | Disposition: A | Discharge status: Home / Self Care | Payer: Commercial Managed Care - HMO | Source: Ambulatory Visit | Attending: Gastroenterology | Admitting: Gastroenterology

## 2015-03-31 DIAGNOSIS — K7460 Unspecified cirrhosis of liver: Secondary | ICD-10-CM | POA: Diagnosis not present

## 2015-04-08 DIAGNOSIS — M545 Low back pain: Secondary | ICD-10-CM | POA: Diagnosis not present

## 2015-04-08 DIAGNOSIS — I10 Essential (primary) hypertension: Secondary | ICD-10-CM | POA: Diagnosis not present

## 2015-04-09 ENCOUNTER — Encounter: Payer: Self-pay | Admitting: *Deleted

## 2015-04-09 ENCOUNTER — Other Ambulatory Visit: Payer: Self-pay | Admitting: *Deleted

## 2015-04-09 DIAGNOSIS — K219 Gastro-esophageal reflux disease without esophagitis: Secondary | ICD-10-CM | POA: Diagnosis not present

## 2015-04-09 DIAGNOSIS — I5032 Chronic diastolic (congestive) heart failure: Secondary | ICD-10-CM | POA: Diagnosis not present

## 2015-04-09 DIAGNOSIS — J45909 Unspecified asthma, uncomplicated: Secondary | ICD-10-CM | POA: Diagnosis not present

## 2015-04-09 DIAGNOSIS — I110 Hypertensive heart disease with heart failure: Secondary | ICD-10-CM | POA: Diagnosis not present

## 2015-04-09 DIAGNOSIS — Z7982 Long term (current) use of aspirin: Secondary | ICD-10-CM | POA: Diagnosis not present

## 2015-04-09 DIAGNOSIS — Z7951 Long term (current) use of inhaled steroids: Secondary | ICD-10-CM | POA: Diagnosis not present

## 2015-04-09 DIAGNOSIS — F419 Anxiety disorder, unspecified: Secondary | ICD-10-CM | POA: Diagnosis not present

## 2015-04-09 DIAGNOSIS — K7031 Alcoholic cirrhosis of liver with ascites: Secondary | ICD-10-CM | POA: Diagnosis not present

## 2015-04-09 DIAGNOSIS — J449 Chronic obstructive pulmonary disease, unspecified: Secondary | ICD-10-CM | POA: Diagnosis not present

## 2015-04-09 NOTE — Patient Outreach (Signed)
[facility]) Care Management  04/09/2015  Melissa Stout 1960/11/26 [id number]   S:  Pt states she is doing well. Pt weight is stable only has 2# variation. She denies edema. She denies SOB. She still have the O2 that she has never used.  O: BP 108/70 mmHg  Pulse 78  Resp 18  Ht 1.676 m (5' 6" )  Wt 156 lb (70.761 kg)  BMI 25.19 kg/m2  SpO2 95%      RRR      Lungs clear      Bowel sounds active.      No edema.  A:  HF - stable   P:  Put in request for O2 discontinuation and pick up.       I will see pt again in one month.  MR. MEYERS PLEASE DISCONTINUE O2 ([place]) [name]  THN CM Care Plan Problem One        Most Recent Value   Care Plan Problem One  at risk for readmission   Role Documenting the Problem One  Care Management [place] for Problem One  Active   THN Long Term Goal (31-90 days)  member will not be hospitalized within the next 31 days.   THN Long Term Goal Start Date  02/20/15   THN Long Term Goal Met Date  04/09/15   Interventions for Problem One Long Term Goal  Reinforced early contact with MD if problems arise.   THN CM Short Term Goal #1 (0-30 days)  member will schedule follow up appointment within the next 1-2 weeks.   THN CM Short Term Goal #1 Start Date  02/20/15   [facility] CM Short Term Goal #1 Met Date  03/11/15   Interventions for Short Term Goal #1  Pt to see her primary care MD tomorrow.   THN CM Short Term Goal #2 (0-30 days)  member will verbalize understanding of how to use oxygen tank within the next 30 days.   THN CM Short Term Goal #2 Start Date  02/20/15   [facility] CM Short Term Goal #2 Met Date  04/09/15   Interventions for Short Term Goal #2  Not using, wants it to be discontinued, will request this from MD.    [facility] CM Care Plan Problem Two        Most Recent Value   Care Plan Problem Two  New diagnosis of CHF.   Role Documenting the Problem Two  Care Management Coordinator   Care Plan for Problem  Two  Active   Interventions for Problem Two Long Term Goal   Reinforced all self managment skills.   THN Long Term Goal (31-90) days  Pt will not be readmitted for CHF in the next 90 days. [Pt will not be readmittted for CHF in the next 90 days.]   THN Long Term Goal Start Date  03/11/15   THN CM Short Term Goal #1 (0-30 days)  Pt will call NP if weight goes up 3# in a day or 5# over several days.   THN CM Short Term Goal #1 Start Date  03/11/15   THN CM Short Term Goal #1 Met Date   04/09/15   THN CM Short Term Goal #2 (0-30 days)  Pt will begin weighing daily and recording her weight in the [facility] planner for the next 30 days.   THN CM Short Term Goal #2 Start Date  03/11/15   [facility] CM Short Term Goal #  2 Met Date  04/09/15   Interventions for Short Term Goal #2  Provided [facility] planner, demonstrated the CHF section, CHF Action Plan and weight record.     Deloria Lair [facility] [place] [phone]

## 2015-04-20 ENCOUNTER — Encounter: Payer: Self-pay | Admitting: *Deleted

## 2015-04-20 DIAGNOSIS — J449 Chronic obstructive pulmonary disease, unspecified: Secondary | ICD-10-CM | POA: Diagnosis not present

## 2015-04-20 DIAGNOSIS — J45909 Unspecified asthma, uncomplicated: Secondary | ICD-10-CM | POA: Diagnosis not present

## 2015-04-20 DIAGNOSIS — F419 Anxiety disorder, unspecified: Secondary | ICD-10-CM | POA: Diagnosis not present

## 2015-04-20 DIAGNOSIS — K219 Gastro-esophageal reflux disease without esophagitis: Secondary | ICD-10-CM | POA: Diagnosis not present

## 2015-04-20 DIAGNOSIS — I110 Hypertensive heart disease with heart failure: Secondary | ICD-10-CM | POA: Diagnosis not present

## 2015-04-20 DIAGNOSIS — I5032 Chronic diastolic (congestive) heart failure: Secondary | ICD-10-CM | POA: Diagnosis not present

## 2015-04-20 DIAGNOSIS — K7031 Alcoholic cirrhosis of liver with ascites: Secondary | ICD-10-CM | POA: Diagnosis not present

## 2015-04-20 DIAGNOSIS — Z7982 Long term (current) use of aspirin: Secondary | ICD-10-CM | POA: Diagnosis not present

## 2015-04-20 DIAGNOSIS — Z7951 Long term (current) use of inhaled steroids: Secondary | ICD-10-CM | POA: Diagnosis not present

## 2015-04-30 ENCOUNTER — Encounter: Payer: Self-pay | Admitting: *Deleted

## 2015-04-30 ENCOUNTER — Other Ambulatory Visit: Payer: Self-pay | Admitting: *Deleted

## 2015-04-30 NOTE — Patient Outreach (Addendum)
[facility]) Care Management  04/30/2015  Melissa Stout January 08, 1961 [id number]   S:   Pt is doing very well, taking her health much more seriously. She is weighing daily, avoiding salt,        taking her meds and getting some daily activity. She has seen Dr. Olen Pel this month.  O:  BP 90/64 mmHg  Pulse 94  Resp 16  Wt 158 lb (71.668 kg)  SpO2 98%        RRR       Lungs are clear       No edema  A:   CHF - well managed  P:  Will close pt case today. Encouraged following daily self check and CHF daily self mangement.       Call me or Dr. Olen Pel if any problems arise early for quick intervention.  THN CM Care Plan Problem One        Most Recent Value   Care Plan Problem One  at risk for readmission   Role Documenting the Problem One  Care Management [place] for Problem One  Active   THN Long Term Goal (31-90 days)  member will not be hospitalized within the next 31 days.   THN Long Term Goal Start Date  02/20/15   THN Long Term Goal Met Date  04/09/15   Interventions for Problem One Long Term Goal  Reinforced early contact with MD if problems arise.   THN CM Short Term Goal #1 (0-30 days)  member will schedule follow up appointment within the next 1-2 weeks.   THN CM Short Term Goal #1 Start Date  02/20/15   [facility] CM Short Term Goal #1 Met Date  03/11/15   Interventions for Short Term Goal #1  Pt to see her primary care MD tomorrow.   THN CM Short Term Goal #2 (0-30 days)  member will verbalize understanding of how to use oxygen tank within the next 30 days.   THN CM Short Term Goal #2 Start Date  02/20/15   [facility] CM Short Term Goal #2 Met Date  04/09/15   Interventions for Short Term Goal #2  Not using, wants it to be discontinued, will request this from MD.    [facility] CM Care Plan Problem Two        Most Recent Value   Care Plan Problem Two  New diagnosis of CHF.   Role Documenting the Problem Two  Care Management Coordinator   Care Plan for Problem  Two  Active   Interventions for Problem Two Long Term Goal   Pt has not readmitted during my activity with her. Reinvoeced her daily CHF self managament.   THN Long Term Goal (31-90) days  Pt will not be readmitted for CHF in the next 90 days. [Pt will not be readmittted for CHF in the next 90 days.]   THN Long Term Goal Start Date  03/11/15   THN Long Term Goal Met Date  04/30/15   THN CM Short Term Goal #1 (0-30 days)  Pt will call NP if weight goes up 3# in a day or 5# over several days.   THN CM Short Term Goal #1 Start Date  03/11/15   THN CM Short Term Goal #1 Met Date   04/09/15   THN CM Short Term Goal #2 (0-30 days)  Pt will begin weighing daily and recording her weight in the [facility] planner for the next 30 days.  THN CM Short Term Goal #2 Start Date  03/11/15   [facility] CM Short Term Goal #2 Met Date  04/09/15     Deloria Lair [facility] [place] [phone]

## 2015-08-07 DIAGNOSIS — J4520 Mild intermittent asthma, uncomplicated: Secondary | ICD-10-CM | POA: Diagnosis not present

## 2015-08-07 DIAGNOSIS — M545 Low back pain: Secondary | ICD-10-CM | POA: Diagnosis not present

## 2015-08-07 DIAGNOSIS — K219 Gastro-esophageal reflux disease without esophagitis: Secondary | ICD-10-CM | POA: Diagnosis not present

## 2015-08-07 DIAGNOSIS — F419 Anxiety disorder, unspecified: Secondary | ICD-10-CM | POA: Diagnosis not present

## 2015-08-07 DIAGNOSIS — I10 Essential (primary) hypertension: Secondary | ICD-10-CM | POA: Diagnosis not present

## 2015-08-07 DIAGNOSIS — K7460 Unspecified cirrhosis of liver: Secondary | ICD-10-CM | POA: Diagnosis not present

## 2015-08-07 DIAGNOSIS — F329 Major depressive disorder, single episode, unspecified: Secondary | ICD-10-CM | POA: Diagnosis not present

## 2015-08-07 DIAGNOSIS — N183 Chronic kidney disease, stage 3 (moderate): Secondary | ICD-10-CM | POA: Diagnosis not present

## 2015-08-11 DIAGNOSIS — K769 Liver disease, unspecified: Secondary | ICD-10-CM | POA: Diagnosis not present

## 2015-08-11 DIAGNOSIS — K7030 Alcoholic cirrhosis of liver without ascites: Secondary | ICD-10-CM | POA: Diagnosis not present

## 2015-08-11 DIAGNOSIS — R188 Other ascites: Secondary | ICD-10-CM | POA: Diagnosis not present

## 2015-10-07 ENCOUNTER — Other Ambulatory Visit: Payer: Self-pay | Admitting: Gastroenterology

## 2015-10-07 DIAGNOSIS — K7469 Other cirrhosis of liver: Secondary | ICD-10-CM

## 2015-10-22 ENCOUNTER — Ambulatory Visit
Admission: RE | Admit: 2015-10-22 | Discharge: 2015-10-22 | Disposition: A | Discharge status: Home / Self Care | Payer: Commercial Managed Care - HMO | Source: Ambulatory Visit | Attending: Gastroenterology | Admitting: Gastroenterology

## 2015-10-22 DIAGNOSIS — K7460 Unspecified cirrhosis of liver: Secondary | ICD-10-CM | POA: Diagnosis not present

## 2015-10-22 DIAGNOSIS — K7469 Other cirrhosis of liver: Secondary | ICD-10-CM

## 2015-11-03 DIAGNOSIS — I10 Essential (primary) hypertension: Secondary | ICD-10-CM | POA: Diagnosis not present

## 2015-11-03 DIAGNOSIS — K7460 Unspecified cirrhosis of liver: Secondary | ICD-10-CM | POA: Diagnosis not present

## 2015-11-03 DIAGNOSIS — F329 Major depressive disorder, single episode, unspecified: Secondary | ICD-10-CM | POA: Diagnosis not present

## 2015-11-03 DIAGNOSIS — K219 Gastro-esophageal reflux disease without esophagitis: Secondary | ICD-10-CM | POA: Diagnosis not present

## 2015-11-03 DIAGNOSIS — J4520 Mild intermittent asthma, uncomplicated: Secondary | ICD-10-CM | POA: Diagnosis not present

## 2015-11-03 DIAGNOSIS — F419 Anxiety disorder, unspecified: Secondary | ICD-10-CM | POA: Diagnosis not present

## 2015-11-03 DIAGNOSIS — N183 Chronic kidney disease, stage 3 (moderate): Secondary | ICD-10-CM | POA: Diagnosis not present

## 2015-11-03 DIAGNOSIS — M545 Low back pain: Secondary | ICD-10-CM | POA: Diagnosis not present

## 2015-12-25 DIAGNOSIS — Z23 Encounter for immunization: Secondary | ICD-10-CM | POA: Diagnosis not present

## 2016-02-02 DIAGNOSIS — M545 Low back pain: Secondary | ICD-10-CM | POA: Diagnosis not present

## 2016-02-02 DIAGNOSIS — I10 Essential (primary) hypertension: Secondary | ICD-10-CM | POA: Diagnosis not present

## 2016-02-18 ENCOUNTER — Other Ambulatory Visit: Payer: Self-pay | Admitting: Gastroenterology

## 2016-02-18 DIAGNOSIS — R7309 Other abnormal glucose: Secondary | ICD-10-CM | POA: Diagnosis not present

## 2016-02-18 DIAGNOSIS — R188 Other ascites: Secondary | ICD-10-CM | POA: Diagnosis not present

## 2016-02-18 DIAGNOSIS — R635 Abnormal weight gain: Secondary | ICD-10-CM | POA: Diagnosis not present

## 2016-02-18 DIAGNOSIS — K7469 Other cirrhosis of liver: Secondary | ICD-10-CM

## 2016-02-18 DIAGNOSIS — K7030 Alcoholic cirrhosis of liver without ascites: Secondary | ICD-10-CM | POA: Diagnosis not present

## 2016-02-18 DIAGNOSIS — R140 Abdominal distension (gaseous): Secondary | ICD-10-CM

## 2016-02-22 DIAGNOSIS — M2012 Hallux valgus (acquired), left foot: Secondary | ICD-10-CM | POA: Diagnosis not present

## 2016-02-22 DIAGNOSIS — M2041 Other hammer toe(s) (acquired), right foot: Secondary | ICD-10-CM | POA: Diagnosis not present

## 2016-02-22 DIAGNOSIS — M2011 Hallux valgus (acquired), right foot: Secondary | ICD-10-CM | POA: Diagnosis not present

## 2016-02-22 DIAGNOSIS — L602 Onychogryphosis: Secondary | ICD-10-CM | POA: Diagnosis not present

## 2016-02-22 DIAGNOSIS — M2042 Other hammer toe(s) (acquired), left foot: Secondary | ICD-10-CM | POA: Diagnosis not present

## 2016-02-22 DIAGNOSIS — G609 Hereditary and idiopathic neuropathy, unspecified: Secondary | ICD-10-CM | POA: Diagnosis not present

## 2016-02-26 ENCOUNTER — Ambulatory Visit
Admission: RE | Admit: 2016-02-26 | Discharge: 2016-02-26 | Disposition: A | Discharge status: Home / Self Care | Payer: Medicare HMO | Source: Ambulatory Visit | Attending: Gastroenterology | Admitting: Gastroenterology

## 2016-02-26 DIAGNOSIS — R188 Other ascites: Secondary | ICD-10-CM

## 2016-02-26 DIAGNOSIS — K7469 Other cirrhosis of liver: Secondary | ICD-10-CM

## 2016-02-26 DIAGNOSIS — K7460 Unspecified cirrhosis of liver: Secondary | ICD-10-CM | POA: Diagnosis not present

## 2016-02-26 DIAGNOSIS — R140 Abdominal distension (gaseous): Secondary | ICD-10-CM

## 2016-03-01 DIAGNOSIS — I10 Essential (primary) hypertension: Secondary | ICD-10-CM | POA: Diagnosis not present

## 2016-03-01 DIAGNOSIS — Z794 Long term (current) use of insulin: Secondary | ICD-10-CM | POA: Diagnosis not present

## 2016-03-01 DIAGNOSIS — E119 Type 2 diabetes mellitus without complications: Secondary | ICD-10-CM | POA: Diagnosis not present

## 2016-04-08 DIAGNOSIS — E119 Type 2 diabetes mellitus without complications: Secondary | ICD-10-CM | POA: Diagnosis not present

## 2016-04-08 DIAGNOSIS — Z794 Long term (current) use of insulin: Secondary | ICD-10-CM | POA: Diagnosis not present

## 2016-04-13 DIAGNOSIS — K7460 Unspecified cirrhosis of liver: Secondary | ICD-10-CM | POA: Diagnosis not present

## 2016-04-13 DIAGNOSIS — E1142 Type 2 diabetes mellitus with diabetic polyneuropathy: Secondary | ICD-10-CM | POA: Diagnosis not present

## 2016-04-13 DIAGNOSIS — Z6832 Body mass index (BMI) 32.0-32.9, adult: Secondary | ICD-10-CM | POA: Diagnosis not present

## 2016-04-13 DIAGNOSIS — E1165 Type 2 diabetes mellitus with hyperglycemia: Secondary | ICD-10-CM | POA: Diagnosis not present

## 2016-04-13 DIAGNOSIS — E669 Obesity, unspecified: Secondary | ICD-10-CM | POA: Diagnosis not present

## 2016-04-13 DIAGNOSIS — Z720 Tobacco use: Secondary | ICD-10-CM | POA: Diagnosis not present

## 2016-04-13 DIAGNOSIS — Z794 Long term (current) use of insulin: Secondary | ICD-10-CM | POA: Diagnosis not present

## 2016-04-13 DIAGNOSIS — Z833 Family history of diabetes mellitus: Secondary | ICD-10-CM | POA: Diagnosis not present

## 2016-04-22 DIAGNOSIS — E1165 Type 2 diabetes mellitus with hyperglycemia: Secondary | ICD-10-CM | POA: Diagnosis not present

## 2016-04-22 DIAGNOSIS — E669 Obesity, unspecified: Secondary | ICD-10-CM | POA: Diagnosis not present

## 2016-04-28 DIAGNOSIS — M545 Low back pain: Secondary | ICD-10-CM | POA: Diagnosis not present

## 2016-04-28 DIAGNOSIS — Z0000 Encounter for general adult medical examination without abnormal findings: Secondary | ICD-10-CM | POA: Diagnosis not present

## 2016-04-28 DIAGNOSIS — Z8659 Personal history of other mental and behavioral disorders: Secondary | ICD-10-CM | POA: Diagnosis not present

## 2016-04-28 DIAGNOSIS — I10 Essential (primary) hypertension: Secondary | ICD-10-CM | POA: Diagnosis not present

## 2016-08-03 ENCOUNTER — Other Ambulatory Visit: Payer: Self-pay | Admitting: Gastroenterology

## 2016-08-03 DIAGNOSIS — K7469 Other cirrhosis of liver: Secondary | ICD-10-CM

## 2016-08-03 DIAGNOSIS — K7460 Unspecified cirrhosis of liver: Secondary | ICD-10-CM | POA: Diagnosis not present

## 2016-08-16 DIAGNOSIS — Z1239 Encounter for other screening for malignant neoplasm of breast: Secondary | ICD-10-CM | POA: Diagnosis not present

## 2016-08-16 DIAGNOSIS — M545 Low back pain: Secondary | ICD-10-CM | POA: Diagnosis not present

## 2016-08-24 ENCOUNTER — Ambulatory Visit
Admission: RE | Admit: 2016-08-24 | Discharge: 2016-08-24 | Disposition: A | Discharge status: Home / Self Care | Payer: Medicare HMO | Source: Ambulatory Visit | Attending: Gastroenterology | Admitting: Gastroenterology

## 2016-08-24 DIAGNOSIS — K7469 Other cirrhosis of liver: Secondary | ICD-10-CM

## 2016-08-24 DIAGNOSIS — K7460 Unspecified cirrhosis of liver: Secondary | ICD-10-CM | POA: Diagnosis not present

## 2016-08-29 DIAGNOSIS — E119 Type 2 diabetes mellitus without complications: Secondary | ICD-10-CM | POA: Diagnosis not present

## 2016-08-29 DIAGNOSIS — Z6832 Body mass index (BMI) 32.0-32.9, adult: Secondary | ICD-10-CM | POA: Diagnosis not present

## 2016-08-29 DIAGNOSIS — Z794 Long term (current) use of insulin: Secondary | ICD-10-CM | POA: Diagnosis not present

## 2016-08-29 DIAGNOSIS — Z833 Family history of diabetes mellitus: Secondary | ICD-10-CM | POA: Diagnosis not present

## 2016-08-29 DIAGNOSIS — E669 Obesity, unspecified: Secondary | ICD-10-CM | POA: Diagnosis not present

## 2016-08-29 DIAGNOSIS — K7460 Unspecified cirrhosis of liver: Secondary | ICD-10-CM | POA: Diagnosis not present

## 2016-08-29 DIAGNOSIS — Z720 Tobacco use: Secondary | ICD-10-CM | POA: Diagnosis not present

## 2016-09-01 DIAGNOSIS — K766 Portal hypertension: Secondary | ICD-10-CM | POA: Diagnosis not present

## 2016-09-01 DIAGNOSIS — Z13810 Encounter for screening for upper gastrointestinal disorder: Secondary | ICD-10-CM | POA: Diagnosis not present

## 2016-09-01 DIAGNOSIS — K3189 Other diseases of stomach and duodenum: Secondary | ICD-10-CM | POA: Diagnosis not present

## 2016-10-26 IMAGING — CR DG CHEST 1V PORT
1 series · 1 of 1 positions shown · non-contrast
Comparison: Chest x-ray 02/09/2015.

CLINICAL DATA: 54-year-old female with history of pleural effusion.
Increased work of breathing. History of COPD, asthma, hypertension
and ascites.

EXAM:
PORTABLE CHEST 1 VIEW

[AP]
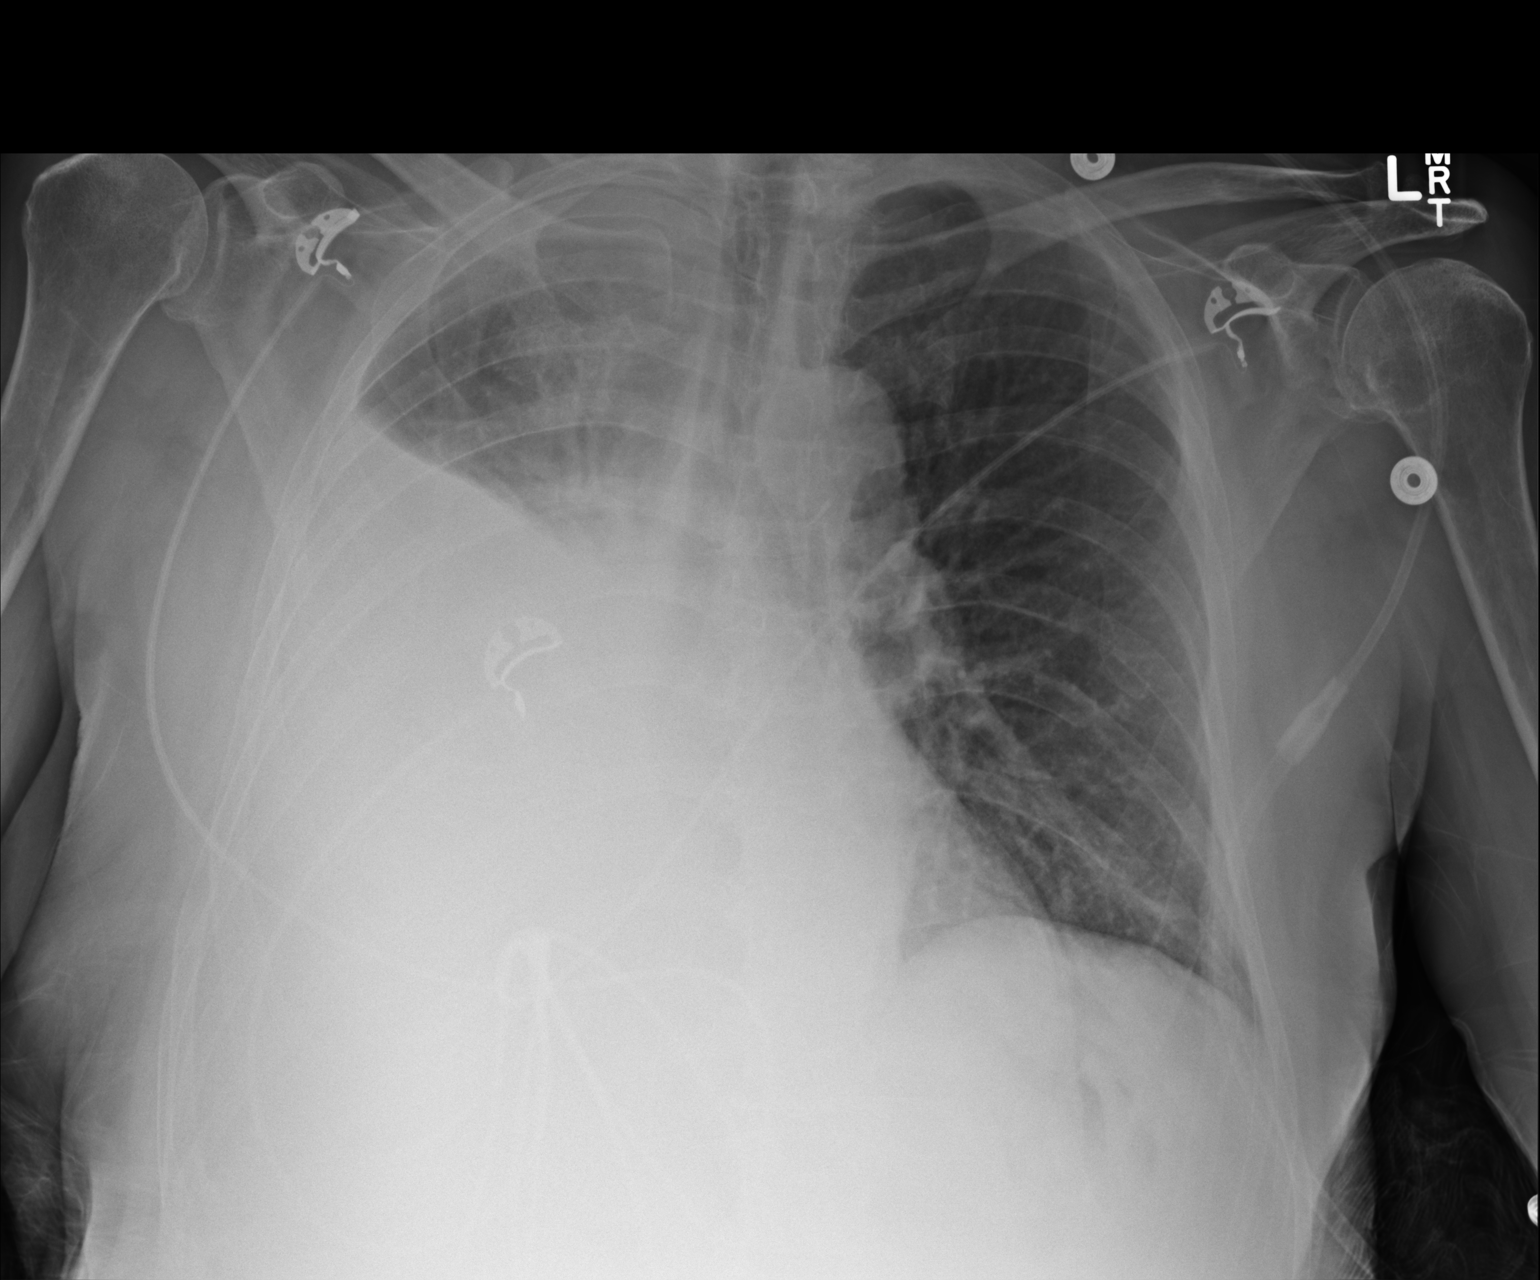

[1 of 1 positions shown; findings below may reference images not displayed]

FINDINGS: Further enlargement of a large right pleural effusion with extensive
passive atelectasis throughout much of the right lung. Decreasing
residual aerated regions in the right upper lobe indicative of
further atelectasis compared to the recent prior examination. Left
lung is clear. No left pleural effusion. No evidence of pulmonary
edema. No pneumothorax. Cardiac silhouette is partially obscured,
but appears within normal limits in size.
IMPRESSION: 1. Continued increase in size of large right pleural effusion with
worsening passive atelectasis in the right lung.

## 2016-10-27 IMAGING — DX DG CHEST 1V
1 series · 1 of 1 positions shown · non-contrast
Comparison: 02/14/2015 and prior studies

CLINICAL DATA: 54-year-old female with status post thoracentesis
for right pleural effusion.

EXAM:
CHEST 1 VIEW

[w chest pa]
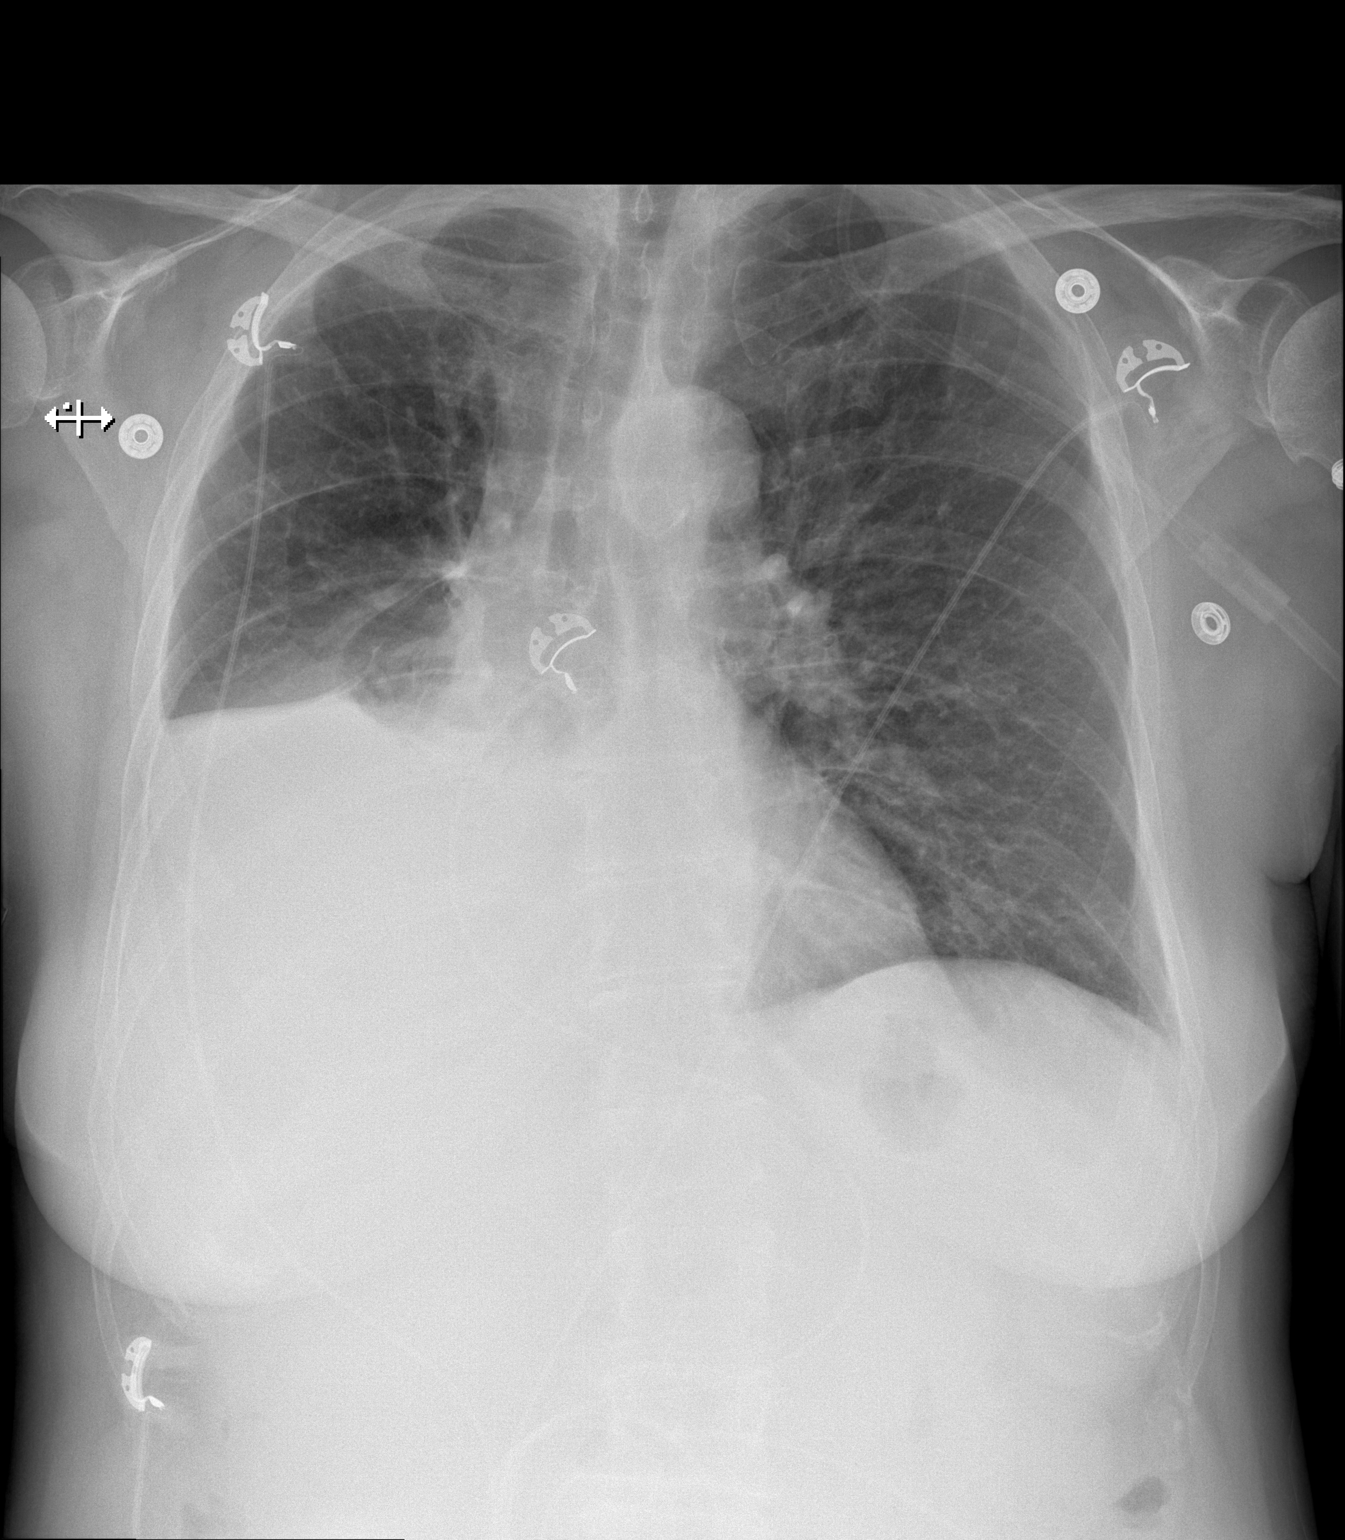

[1 of 1 positions shown; findings below may reference images not displayed]

FINDINGS: Right pleural effusion has decreased in size since the prior study
with continued right lower lung atelectasis.

There is no evidence of pneumothorax.

No other significant changes identified.
IMPRESSION: Decreased right pleural effusion.  No evidence of pneumothorax.

## 2016-10-27 IMAGING — DX DG CHEST 1V PORT
1 series · 1 of 1 positions shown · non-contrast
Comparison: Chest radiograph earlier same day.

CLINICAL DATA: Patient status post right-sided thoracentesis.
Shortness of breath. Worsening cough.

EXAM:
PORTABLE CHEST 1 VIEW

[chest ap]
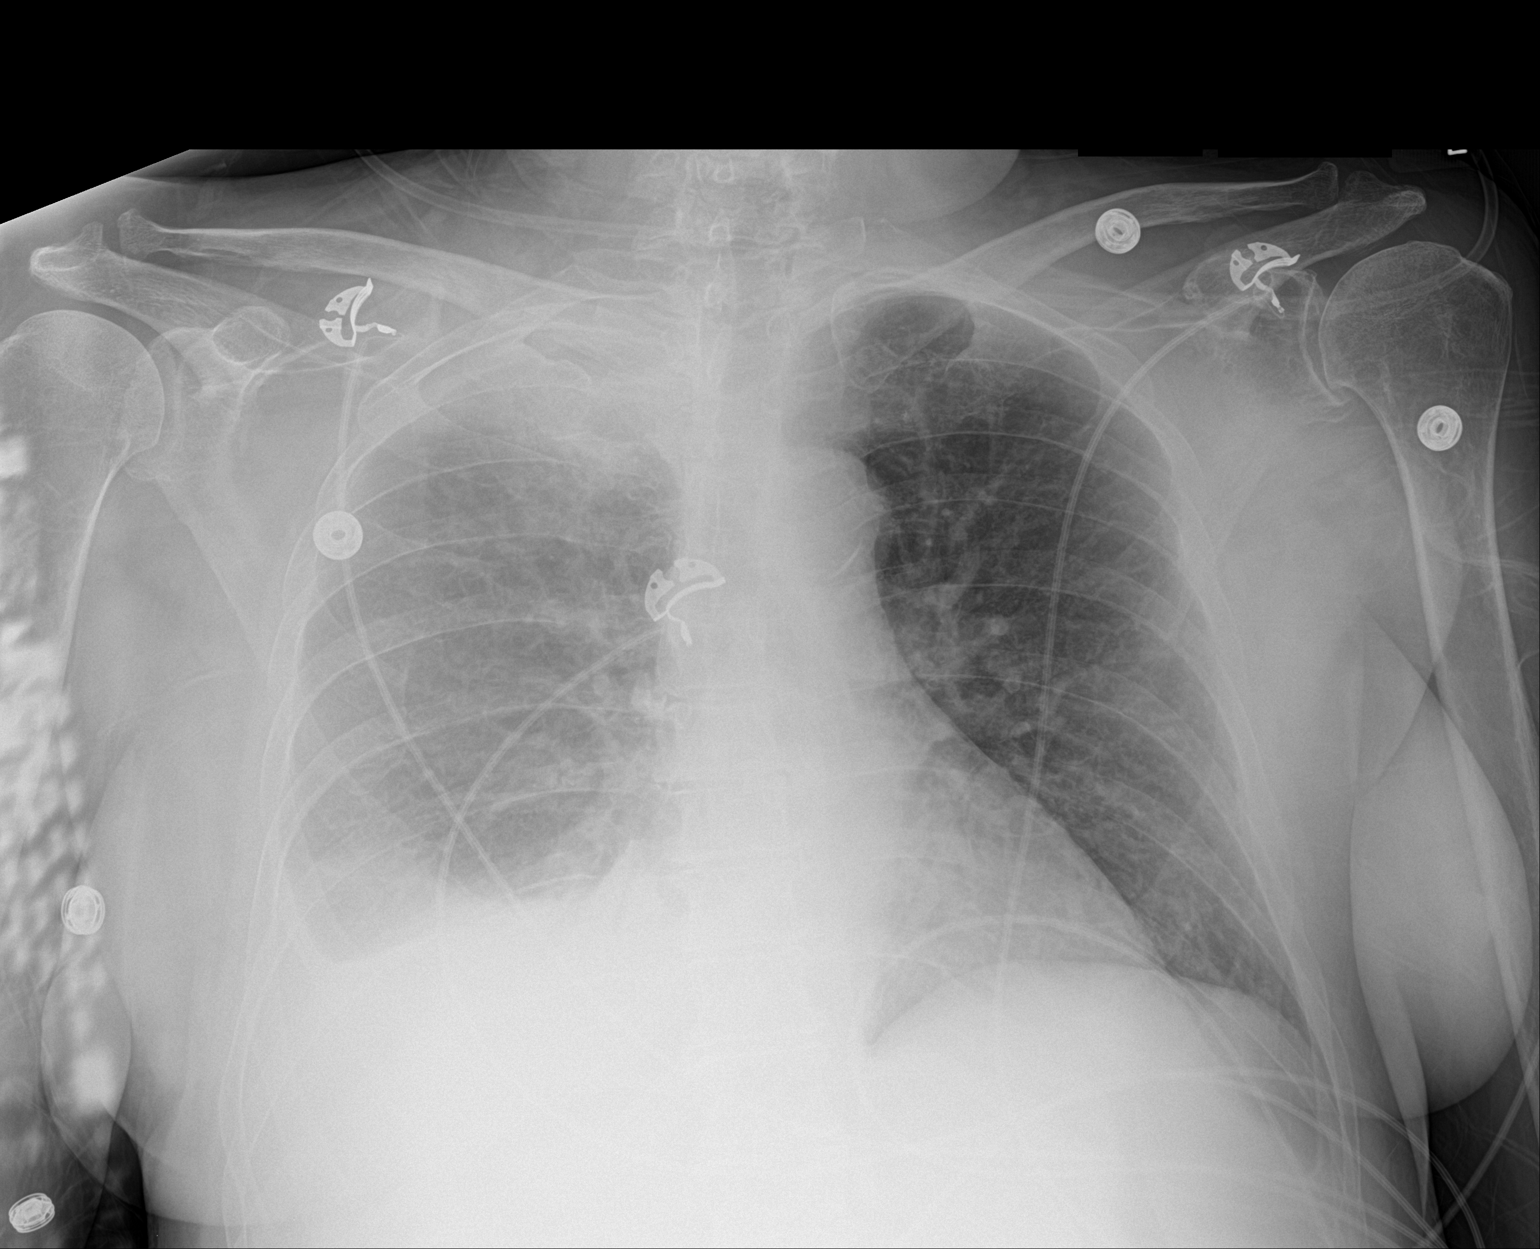

[1 of 1 positions shown; findings below may reference images not displayed]

FINDINGS: Monitoring leads overlie the patient. Stable cardiac and mediastinal
contours. Unchanged moderate right-sided pleural effusion, layering
with underlying consolidative opacities within the right hemi
thorax. Left lung is clear. No pneumothorax.
IMPRESSION: Layering moderate right pleural effusion with underlying pulmonary
consolidation most suggestive of atelectasis. No large pneumothorax
identified.

## 2016-10-29 IMAGING — CR DG CHEST 1V PORT
1 series · 1 of 1 positions shown · non-contrast
Comparison: 02/15/2015.

CLINICAL DATA: Right pleural effusion.

EXAM:
PORTABLE CHEST 1 VIEW

[AP]
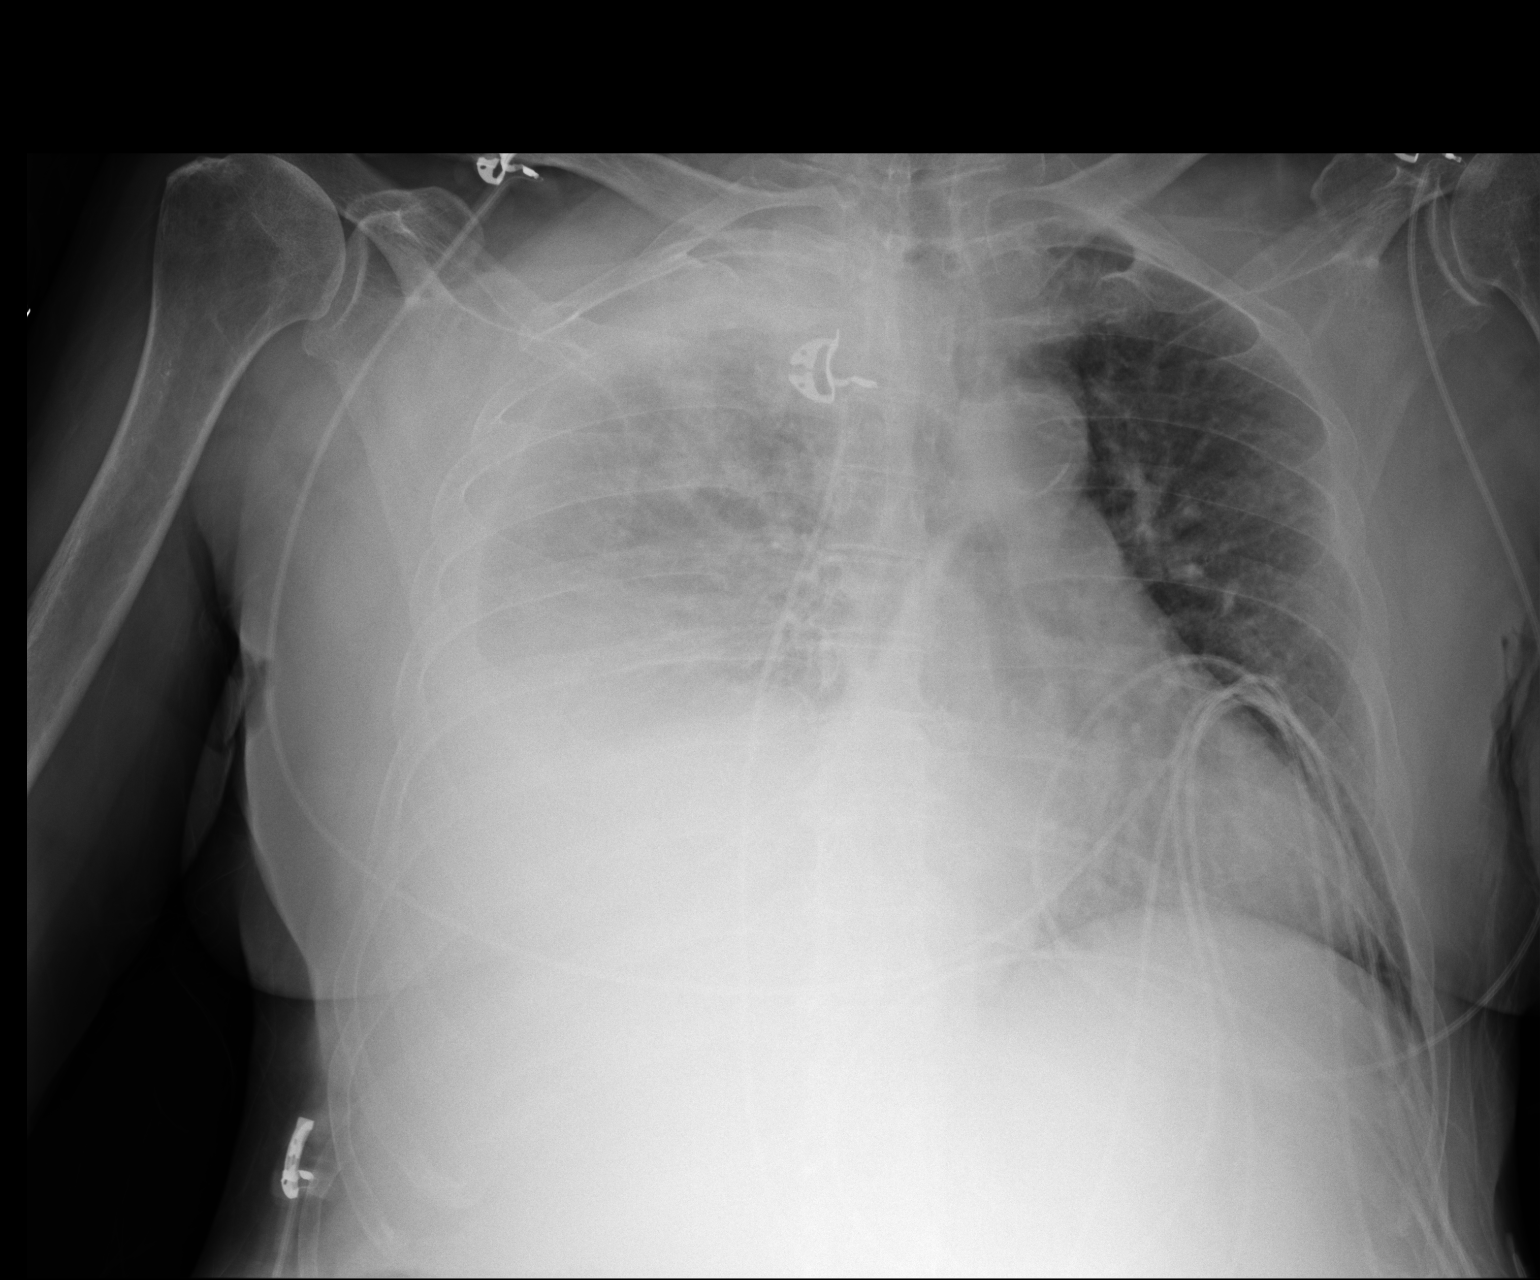

[1 of 1 positions shown; findings below may reference images not displayed]

FINDINGS: Heart size stable. Progressive right pleural effusion. Underlying
right base atelectasis. Left lung is clear. No pneumothorax. No
acute osseous abnormality.
IMPRESSION: Progressive large right pleural effusion.  Right base atelectasis.

## 2016-10-31 IMAGING — CR DG CHEST 1V PORT
1 series · 1 of 1 positions shown · non-contrast
Comparison: Portable chest x-ray February 17, 2015

CLINICAL DATA: Follow-up hydrothorax

EXAM:
PORTABLE CHEST 1 VIEW

[AP]
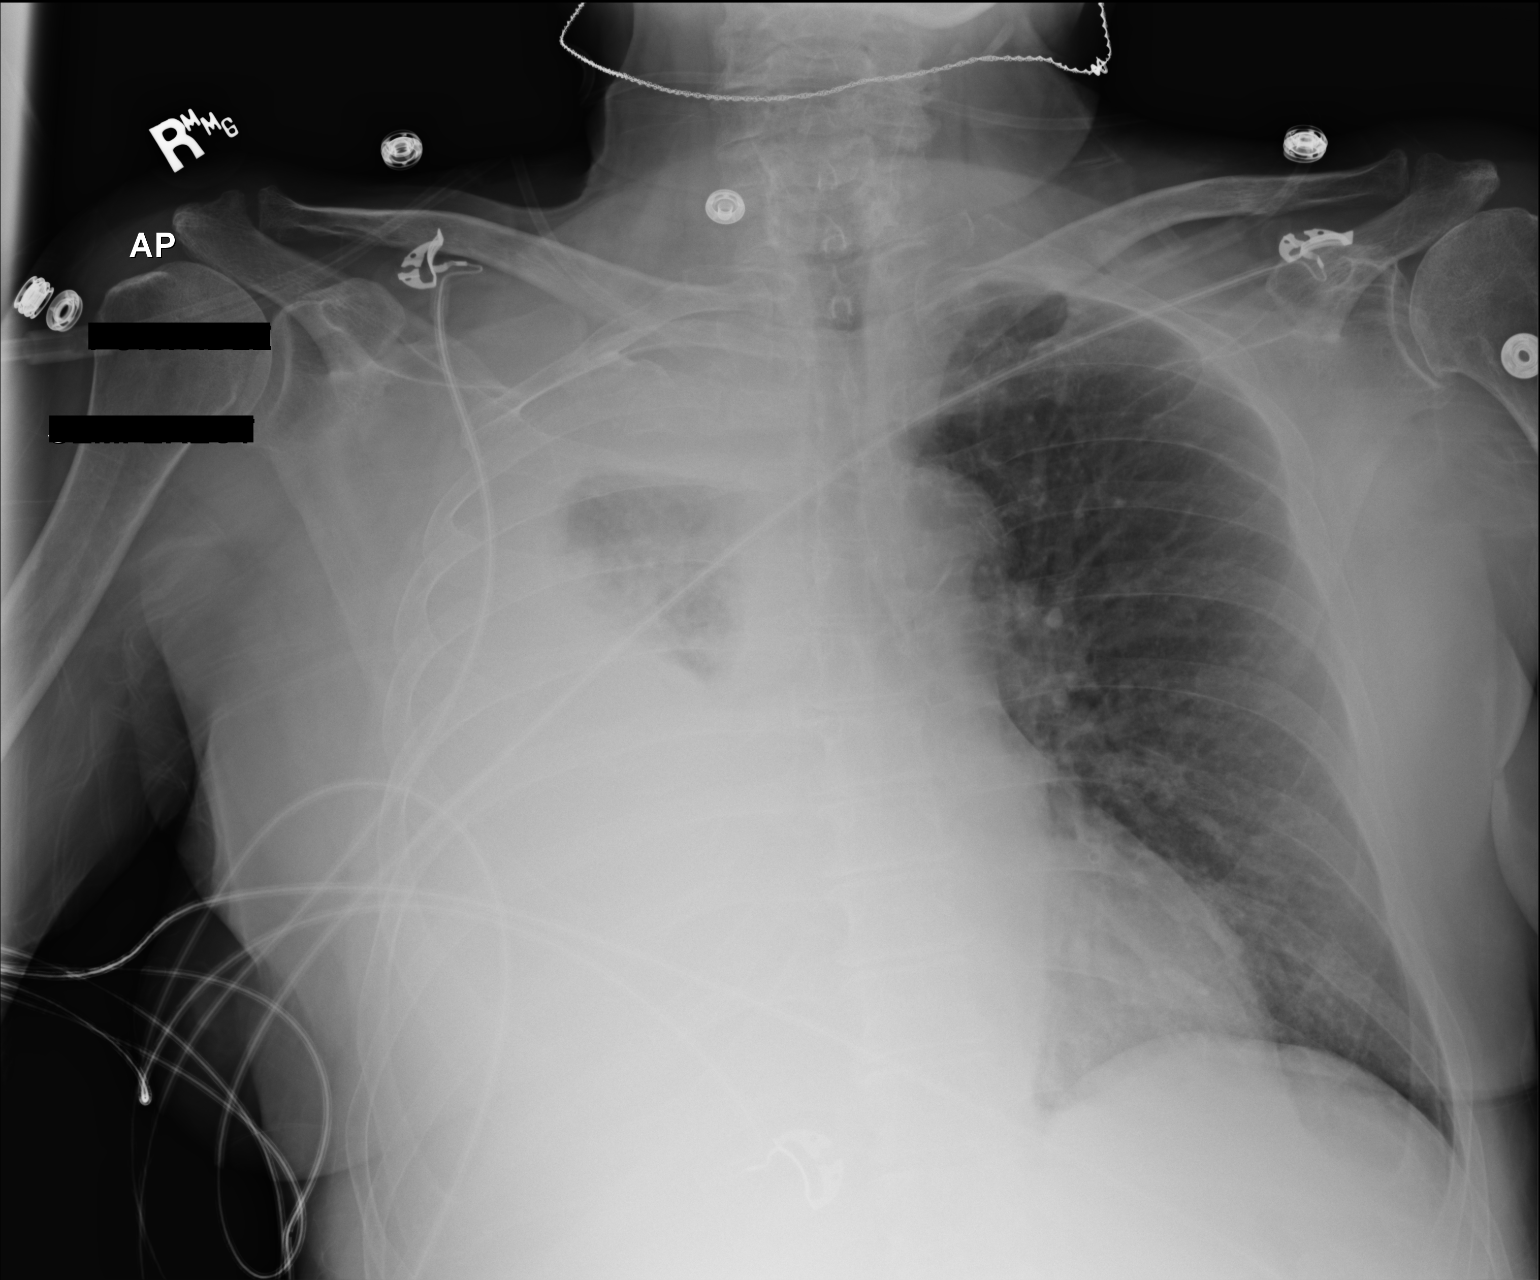

[1 of 1 positions shown; findings below may reference images not displayed]

FINDINGS: The volume of pleural fluid on the right is increased in on a very
small amount of residual aerated right upper lobe is demonstrated.
There is no mediastinal shift. The left lung is well-expanded and
clear. The left heart border is normal. The pulmonary vascularity is
not engorged. No pneumothorax is observed. The observed bony thorax
is unremarkable.
IMPRESSION: Interval increase in the volume of the large right pleural effusion.

## 2016-11-11 DIAGNOSIS — Z23 Encounter for immunization: Secondary | ICD-10-CM | POA: Diagnosis not present

## 2016-11-15 DIAGNOSIS — M545 Low back pain: Secondary | ICD-10-CM | POA: Diagnosis not present

## 2016-11-15 DIAGNOSIS — K219 Gastro-esophageal reflux disease without esophagitis: Secondary | ICD-10-CM | POA: Diagnosis not present

## 2016-11-15 DIAGNOSIS — L989 Disorder of the skin and subcutaneous tissue, unspecified: Secondary | ICD-10-CM | POA: Diagnosis not present

## 2016-11-15 DIAGNOSIS — N183 Chronic kidney disease, stage 3 (moderate): Secondary | ICD-10-CM | POA: Diagnosis not present

## 2016-11-15 DIAGNOSIS — F322 Major depressive disorder, single episode, severe without psychotic features: Secondary | ICD-10-CM | POA: Diagnosis not present

## 2016-11-15 DIAGNOSIS — F419 Anxiety disorder, unspecified: Secondary | ICD-10-CM | POA: Diagnosis not present

## 2016-11-15 DIAGNOSIS — J4520 Mild intermittent asthma, uncomplicated: Secondary | ICD-10-CM | POA: Diagnosis not present

## 2016-11-15 DIAGNOSIS — K7460 Unspecified cirrhosis of liver: Secondary | ICD-10-CM | POA: Diagnosis not present

## 2016-11-15 DIAGNOSIS — I10 Essential (primary) hypertension: Secondary | ICD-10-CM | POA: Diagnosis not present

## 2017-02-09 DIAGNOSIS — Z0000 Encounter for general adult medical examination without abnormal findings: Secondary | ICD-10-CM | POA: Diagnosis not present

## 2017-02-09 DIAGNOSIS — Z1231 Encounter for screening mammogram for malignant neoplasm of breast: Secondary | ICD-10-CM | POA: Diagnosis not present

## 2017-02-09 DIAGNOSIS — Z124 Encounter for screening for malignant neoplasm of cervix: Secondary | ICD-10-CM | POA: Diagnosis not present

## 2017-02-15 ENCOUNTER — Other Ambulatory Visit: Payer: Self-pay | Admitting: Family Medicine

## 2017-02-15 DIAGNOSIS — Z1239 Encounter for other screening for malignant neoplasm of breast: Secondary | ICD-10-CM

## 2017-02-15 DIAGNOSIS — M545 Low back pain: Secondary | ICD-10-CM | POA: Diagnosis not present

## 2017-02-15 DIAGNOSIS — E7800 Pure hypercholesterolemia, unspecified: Secondary | ICD-10-CM | POA: Diagnosis not present

## 2017-02-27 ENCOUNTER — Other Ambulatory Visit: Payer: Self-pay | Admitting: Gastroenterology

## 2017-02-27 DIAGNOSIS — K7460 Unspecified cirrhosis of liver: Secondary | ICD-10-CM

## 2017-03-02 DIAGNOSIS — E1142 Type 2 diabetes mellitus with diabetic polyneuropathy: Secondary | ICD-10-CM | POA: Diagnosis not present

## 2017-03-02 DIAGNOSIS — E669 Obesity, unspecified: Secondary | ICD-10-CM | POA: Diagnosis not present

## 2017-03-02 DIAGNOSIS — Z794 Long term (current) use of insulin: Secondary | ICD-10-CM | POA: Diagnosis not present

## 2017-03-02 DIAGNOSIS — E119 Type 2 diabetes mellitus without complications: Secondary | ICD-10-CM | POA: Diagnosis not present

## 2017-03-02 DIAGNOSIS — Z6834 Body mass index (BMI) 34.0-34.9, adult: Secondary | ICD-10-CM | POA: Diagnosis not present

## 2017-03-02 DIAGNOSIS — Z720 Tobacco use: Secondary | ICD-10-CM | POA: Diagnosis not present

## 2017-03-02 DIAGNOSIS — Z833 Family history of diabetes mellitus: Secondary | ICD-10-CM | POA: Diagnosis not present

## 2017-03-02 DIAGNOSIS — K7460 Unspecified cirrhosis of liver: Secondary | ICD-10-CM | POA: Diagnosis not present

## 2017-03-06 ENCOUNTER — Ambulatory Visit
Admission: RE | Admit: 2017-03-06 | Discharge: 2017-03-06 | Disposition: A | Discharge status: Home / Self Care | Payer: Medicare HMO | Source: Ambulatory Visit | Attending: Gastroenterology | Admitting: Gastroenterology

## 2017-03-06 DIAGNOSIS — K7460 Unspecified cirrhosis of liver: Secondary | ICD-10-CM | POA: Diagnosis not present

## 2017-04-13 DIAGNOSIS — Z86010 Personal history of colonic polyps: Secondary | ICD-10-CM | POA: Diagnosis not present

## 2017-04-13 DIAGNOSIS — K7460 Unspecified cirrhosis of liver: Secondary | ICD-10-CM | POA: Diagnosis not present

## 2017-05-17 DIAGNOSIS — N183 Chronic kidney disease, stage 3 (moderate): Secondary | ICD-10-CM | POA: Diagnosis not present

## 2017-05-17 DIAGNOSIS — F322 Major depressive disorder, single episode, severe without psychotic features: Secondary | ICD-10-CM | POA: Diagnosis not present

## 2017-05-17 DIAGNOSIS — F419 Anxiety disorder, unspecified: Secondary | ICD-10-CM | POA: Diagnosis not present

## 2017-05-17 DIAGNOSIS — E7800 Pure hypercholesterolemia, unspecified: Secondary | ICD-10-CM | POA: Diagnosis not present

## 2017-05-17 DIAGNOSIS — K7460 Unspecified cirrhosis of liver: Secondary | ICD-10-CM | POA: Diagnosis not present

## 2017-05-17 DIAGNOSIS — I10 Essential (primary) hypertension: Secondary | ICD-10-CM | POA: Diagnosis not present

## 2017-05-17 DIAGNOSIS — M545 Low back pain: Secondary | ICD-10-CM | POA: Diagnosis not present

## 2017-05-17 DIAGNOSIS — K219 Gastro-esophageal reflux disease without esophagitis: Secondary | ICD-10-CM | POA: Diagnosis not present

## 2017-05-17 DIAGNOSIS — J4520 Mild intermittent asthma, uncomplicated: Secondary | ICD-10-CM | POA: Diagnosis not present

## 2017-08-16 DIAGNOSIS — M545 Low back pain: Secondary | ICD-10-CM | POA: Diagnosis not present

## 2017-09-04 DIAGNOSIS — E119 Type 2 diabetes mellitus without complications: Secondary | ICD-10-CM | POA: Diagnosis not present

## 2017-09-04 DIAGNOSIS — E669 Obesity, unspecified: Secondary | ICD-10-CM | POA: Diagnosis not present

## 2017-09-04 DIAGNOSIS — Z794 Long term (current) use of insulin: Secondary | ICD-10-CM | POA: Diagnosis not present

## 2017-09-04 DIAGNOSIS — Z833 Family history of diabetes mellitus: Secondary | ICD-10-CM | POA: Diagnosis not present

## 2017-09-04 DIAGNOSIS — E1142 Type 2 diabetes mellitus with diabetic polyneuropathy: Secondary | ICD-10-CM | POA: Diagnosis not present

## 2017-09-27 ENCOUNTER — Other Ambulatory Visit: Payer: Self-pay | Admitting: Gastroenterology

## 2017-09-27 DIAGNOSIS — K7460 Unspecified cirrhosis of liver: Secondary | ICD-10-CM

## 2017-10-18 ENCOUNTER — Other Ambulatory Visit: Payer: Medicare HMO

## 2017-10-20 ENCOUNTER — Ambulatory Visit
Admission: RE | Admit: 2017-10-20 | Discharge: 2017-10-20 | Disposition: A | Discharge status: Home / Self Care | Payer: Medicare HMO | Source: Ambulatory Visit | Attending: Gastroenterology | Admitting: Gastroenterology

## 2017-10-20 DIAGNOSIS — K7460 Unspecified cirrhosis of liver: Secondary | ICD-10-CM

## 2017-11-15 DIAGNOSIS — Z86010 Personal history of colonic polyps: Secondary | ICD-10-CM | POA: Diagnosis not present

## 2017-11-15 DIAGNOSIS — K7460 Unspecified cirrhosis of liver: Secondary | ICD-10-CM | POA: Diagnosis not present

## 2017-11-16 DIAGNOSIS — E7800 Pure hypercholesterolemia, unspecified: Secondary | ICD-10-CM | POA: Diagnosis not present

## 2017-11-16 DIAGNOSIS — N183 Chronic kidney disease, stage 3 (moderate): Secondary | ICD-10-CM | POA: Diagnosis not present

## 2017-11-16 DIAGNOSIS — J4520 Mild intermittent asthma, uncomplicated: Secondary | ICD-10-CM | POA: Diagnosis not present

## 2017-11-16 DIAGNOSIS — K7460 Unspecified cirrhosis of liver: Secondary | ICD-10-CM | POA: Diagnosis not present

## 2017-11-16 DIAGNOSIS — M545 Low back pain: Secondary | ICD-10-CM | POA: Diagnosis not present

## 2017-11-16 DIAGNOSIS — F322 Major depressive disorder, single episode, severe without psychotic features: Secondary | ICD-10-CM | POA: Diagnosis not present

## 2017-11-16 DIAGNOSIS — F419 Anxiety disorder, unspecified: Secondary | ICD-10-CM | POA: Diagnosis not present

## 2017-11-16 DIAGNOSIS — I10 Essential (primary) hypertension: Secondary | ICD-10-CM | POA: Diagnosis not present

## 2017-11-16 DIAGNOSIS — Z23 Encounter for immunization: Secondary | ICD-10-CM | POA: Diagnosis not present

## 2017-11-16 DIAGNOSIS — K219 Gastro-esophageal reflux disease without esophagitis: Secondary | ICD-10-CM | POA: Diagnosis not present

## 2017-11-21 ENCOUNTER — Other Ambulatory Visit: Payer: Self-pay | Admitting: Family Medicine

## 2017-11-21 DIAGNOSIS — Z1231 Encounter for screening mammogram for malignant neoplasm of breast: Secondary | ICD-10-CM

## 2018-01-01 DIAGNOSIS — H25813 Combined forms of age-related cataract, bilateral: Secondary | ICD-10-CM | POA: Diagnosis not present

## 2018-01-01 DIAGNOSIS — E119 Type 2 diabetes mellitus without complications: Secondary | ICD-10-CM | POA: Diagnosis not present

## 2018-01-01 DIAGNOSIS — H35363 Drusen (degenerative) of macula, bilateral: Secondary | ICD-10-CM | POA: Diagnosis not present

## 2018-01-01 DIAGNOSIS — H527 Unspecified disorder of refraction: Secondary | ICD-10-CM | POA: Diagnosis not present

## 2018-02-16 DIAGNOSIS — F1190 Opioid use, unspecified, uncomplicated: Secondary | ICD-10-CM | POA: Diagnosis not present

## 2018-02-16 DIAGNOSIS — M545 Low back pain: Secondary | ICD-10-CM | POA: Diagnosis not present

## 2018-03-22 DIAGNOSIS — Z833 Family history of diabetes mellitus: Secondary | ICD-10-CM | POA: Diagnosis not present

## 2018-03-22 DIAGNOSIS — E1165 Type 2 diabetes mellitus with hyperglycemia: Secondary | ICD-10-CM | POA: Diagnosis not present

## 2018-03-22 DIAGNOSIS — Z794 Long term (current) use of insulin: Secondary | ICD-10-CM | POA: Diagnosis not present

## 2018-03-22 DIAGNOSIS — Z6838 Body mass index (BMI) 38.0-38.9, adult: Secondary | ICD-10-CM | POA: Diagnosis not present

## 2018-03-22 DIAGNOSIS — E1142 Type 2 diabetes mellitus with diabetic polyneuropathy: Secondary | ICD-10-CM | POA: Diagnosis not present

## 2018-03-22 DIAGNOSIS — E669 Obesity, unspecified: Secondary | ICD-10-CM | POA: Diagnosis not present

## 2018-04-18 DIAGNOSIS — L821 Other seborrheic keratosis: Secondary | ICD-10-CM | POA: Diagnosis not present

## 2018-04-18 DIAGNOSIS — L720 Epidermal cyst: Secondary | ICD-10-CM | POA: Diagnosis not present

## 2018-04-19 ENCOUNTER — Other Ambulatory Visit: Payer: Self-pay | Admitting: Gastroenterology

## 2018-04-19 DIAGNOSIS — K7460 Unspecified cirrhosis of liver: Secondary | ICD-10-CM

## 2018-05-04 ENCOUNTER — Other Ambulatory Visit: Payer: Medicare HMO

## 2018-05-17 DIAGNOSIS — E7800 Pure hypercholesterolemia, unspecified: Secondary | ICD-10-CM | POA: Diagnosis not present

## 2018-05-17 DIAGNOSIS — J449 Chronic obstructive pulmonary disease, unspecified: Secondary | ICD-10-CM | POA: Diagnosis not present

## 2018-05-17 DIAGNOSIS — J4520 Mild intermittent asthma, uncomplicated: Secondary | ICD-10-CM | POA: Diagnosis not present

## 2018-05-17 DIAGNOSIS — I10 Essential (primary) hypertension: Secondary | ICD-10-CM | POA: Diagnosis not present

## 2018-05-17 DIAGNOSIS — N183 Chronic kidney disease, stage 3 (moderate): Secondary | ICD-10-CM | POA: Diagnosis not present

## 2018-05-17 DIAGNOSIS — F322 Major depressive disorder, single episode, severe without psychotic features: Secondary | ICD-10-CM | POA: Diagnosis not present

## 2018-05-17 DIAGNOSIS — M545 Low back pain: Secondary | ICD-10-CM | POA: Diagnosis not present

## 2018-05-17 DIAGNOSIS — F419 Anxiety disorder, unspecified: Secondary | ICD-10-CM | POA: Diagnosis not present

## 2018-05-17 DIAGNOSIS — E1169 Type 2 diabetes mellitus with other specified complication: Secondary | ICD-10-CM | POA: Diagnosis not present

## 2018-05-17 DIAGNOSIS — K7460 Unspecified cirrhosis of liver: Secondary | ICD-10-CM | POA: Diagnosis not present

## 2018-08-16 DIAGNOSIS — M545 Low back pain: Secondary | ICD-10-CM | POA: Diagnosis not present

## 2018-09-07 DIAGNOSIS — K7460 Unspecified cirrhosis of liver: Secondary | ICD-10-CM | POA: Diagnosis not present

## 2018-09-07 DIAGNOSIS — Z86010 Personal history of colonic polyps: Secondary | ICD-10-CM | POA: Diagnosis not present

## 2018-09-18 DIAGNOSIS — Z833 Family history of diabetes mellitus: Secondary | ICD-10-CM | POA: Diagnosis not present

## 2018-09-18 DIAGNOSIS — E1142 Type 2 diabetes mellitus with diabetic polyneuropathy: Secondary | ICD-10-CM | POA: Diagnosis not present

## 2018-09-18 DIAGNOSIS — E669 Obesity, unspecified: Secondary | ICD-10-CM | POA: Diagnosis not present

## 2018-09-18 DIAGNOSIS — Z794 Long term (current) use of insulin: Secondary | ICD-10-CM | POA: Diagnosis not present

## 2018-10-05 DIAGNOSIS — E1142 Type 2 diabetes mellitus with diabetic polyneuropathy: Secondary | ICD-10-CM | POA: Diagnosis not present

## 2018-10-05 DIAGNOSIS — Z794 Long term (current) use of insulin: Secondary | ICD-10-CM | POA: Diagnosis not present

## 2018-10-05 DIAGNOSIS — K7460 Unspecified cirrhosis of liver: Secondary | ICD-10-CM | POA: Diagnosis not present

## 2018-11-15 DIAGNOSIS — I10 Essential (primary) hypertension: Secondary | ICD-10-CM | POA: Diagnosis not present

## 2018-11-15 DIAGNOSIS — K219 Gastro-esophageal reflux disease without esophagitis: Secondary | ICD-10-CM | POA: Diagnosis not present

## 2018-11-15 DIAGNOSIS — K7460 Unspecified cirrhosis of liver: Secondary | ICD-10-CM | POA: Diagnosis not present

## 2018-11-15 DIAGNOSIS — J4520 Mild intermittent asthma, uncomplicated: Secondary | ICD-10-CM | POA: Diagnosis not present

## 2018-11-15 DIAGNOSIS — M545 Low back pain: Secondary | ICD-10-CM | POA: Diagnosis not present

## 2018-11-15 DIAGNOSIS — N1831 Chronic kidney disease, stage 3a: Secondary | ICD-10-CM | POA: Diagnosis not present

## 2018-11-15 DIAGNOSIS — F419 Anxiety disorder, unspecified: Secondary | ICD-10-CM | POA: Diagnosis not present

## 2018-11-15 DIAGNOSIS — E7800 Pure hypercholesterolemia, unspecified: Secondary | ICD-10-CM | POA: Diagnosis not present

## 2018-11-15 DIAGNOSIS — F322 Major depressive disorder, single episode, severe without psychotic features: Secondary | ICD-10-CM | POA: Diagnosis not present

## 2018-11-16 ENCOUNTER — Ambulatory Visit
Admission: RE | Admit: 2018-11-16 | Discharge: 2018-11-16 | Disposition: A | Discharge status: Home / Self Care | Payer: Medicare HMO | Source: Ambulatory Visit | Attending: Gastroenterology | Admitting: Gastroenterology

## 2018-11-16 DIAGNOSIS — K760 Fatty (change of) liver, not elsewhere classified: Secondary | ICD-10-CM | POA: Diagnosis not present

## 2018-11-16 DIAGNOSIS — R161 Splenomegaly, not elsewhere classified: Secondary | ICD-10-CM | POA: Diagnosis not present

## 2018-11-16 DIAGNOSIS — K7460 Unspecified cirrhosis of liver: Secondary | ICD-10-CM

## 2018-12-21 DIAGNOSIS — Z1231 Encounter for screening mammogram for malignant neoplasm of breast: Secondary | ICD-10-CM | POA: Diagnosis not present

## 2018-12-26 DIAGNOSIS — Z1159 Encounter for screening for other viral diseases: Secondary | ICD-10-CM | POA: Diagnosis not present

## 2019-01-03 DIAGNOSIS — R928 Other abnormal and inconclusive findings on diagnostic imaging of breast: Secondary | ICD-10-CM | POA: Diagnosis not present

## 2019-01-28 DIAGNOSIS — R928 Other abnormal and inconclusive findings on diagnostic imaging of breast: Secondary | ICD-10-CM | POA: Diagnosis not present

## 2019-02-20 DIAGNOSIS — E1169 Type 2 diabetes mellitus with other specified complication: Secondary | ICD-10-CM | POA: Diagnosis not present

## 2019-02-20 DIAGNOSIS — E7800 Pure hypercholesterolemia, unspecified: Secondary | ICD-10-CM | POA: Diagnosis not present

## 2019-02-20 DIAGNOSIS — N183 Chronic kidney disease, stage 3 unspecified: Secondary | ICD-10-CM | POA: Diagnosis not present

## 2019-02-20 DIAGNOSIS — M545 Low back pain: Secondary | ICD-10-CM | POA: Diagnosis not present

## 2019-03-21 DIAGNOSIS — Z794 Long term (current) use of insulin: Secondary | ICD-10-CM | POA: Diagnosis not present

## 2019-03-21 DIAGNOSIS — E1142 Type 2 diabetes mellitus with diabetic polyneuropathy: Secondary | ICD-10-CM | POA: Diagnosis not present

## 2019-03-21 DIAGNOSIS — Z833 Family history of diabetes mellitus: Secondary | ICD-10-CM | POA: Diagnosis not present

## 2019-03-21 DIAGNOSIS — E669 Obesity, unspecified: Secondary | ICD-10-CM | POA: Diagnosis not present

## 2019-05-23 DIAGNOSIS — M545 Low back pain: Secondary | ICD-10-CM | POA: Diagnosis not present

## 2019-08-21 DIAGNOSIS — M545 Low back pain: Secondary | ICD-10-CM | POA: Diagnosis not present

## 2019-10-09 ENCOUNTER — Other Ambulatory Visit: Payer: Self-pay | Admitting: Gastroenterology

## 2019-10-09 DIAGNOSIS — K7460 Unspecified cirrhosis of liver: Secondary | ICD-10-CM

## 2019-10-17 ENCOUNTER — Ambulatory Visit
Admission: RE | Admit: 2019-10-17 | Discharge: 2019-10-17 | Disposition: A | Discharge status: Home / Self Care | Payer: Medicare HMO | Source: Ambulatory Visit | Attending: Gastroenterology | Admitting: Gastroenterology

## 2019-10-17 DIAGNOSIS — K7460 Unspecified cirrhosis of liver: Secondary | ICD-10-CM | POA: Diagnosis not present

## 2019-11-21 DIAGNOSIS — F322 Major depressive disorder, single episode, severe without psychotic features: Secondary | ICD-10-CM | POA: Diagnosis not present

## 2019-11-21 DIAGNOSIS — F325 Major depressive disorder, single episode, in full remission: Secondary | ICD-10-CM | POA: Diagnosis not present

## 2019-11-21 DIAGNOSIS — J4520 Mild intermittent asthma, uncomplicated: Secondary | ICD-10-CM | POA: Diagnosis not present

## 2019-11-21 DIAGNOSIS — E7800 Pure hypercholesterolemia, unspecified: Secondary | ICD-10-CM | POA: Diagnosis not present

## 2019-11-21 DIAGNOSIS — F419 Anxiety disorder, unspecified: Secondary | ICD-10-CM | POA: Diagnosis not present

## 2019-11-21 DIAGNOSIS — J449 Chronic obstructive pulmonary disease, unspecified: Secondary | ICD-10-CM | POA: Diagnosis not present

## 2019-11-21 DIAGNOSIS — K7460 Unspecified cirrhosis of liver: Secondary | ICD-10-CM | POA: Diagnosis not present

## 2019-11-21 DIAGNOSIS — I10 Essential (primary) hypertension: Secondary | ICD-10-CM | POA: Diagnosis not present

## 2019-11-21 DIAGNOSIS — R0602 Shortness of breath: Secondary | ICD-10-CM | POA: Diagnosis not present

## 2019-11-21 DIAGNOSIS — Z23 Encounter for immunization: Secondary | ICD-10-CM | POA: Diagnosis not present

## 2019-11-21 DIAGNOSIS — E1122 Type 2 diabetes mellitus with diabetic chronic kidney disease: Secondary | ICD-10-CM | POA: Diagnosis not present

## 2019-11-21 DIAGNOSIS — M545 Low back pain, unspecified: Secondary | ICD-10-CM | POA: Diagnosis not present

## 2020-02-21 DIAGNOSIS — M545 Low back pain, unspecified: Secondary | ICD-10-CM | POA: Diagnosis not present

## 2020-04-21 ENCOUNTER — Other Ambulatory Visit: Payer: Self-pay | Admitting: Gastroenterology

## 2020-04-21 DIAGNOSIS — K7460 Unspecified cirrhosis of liver: Secondary | ICD-10-CM

## 2020-05-08 ENCOUNTER — Other Ambulatory Visit: Payer: Medicare HMO

## 2020-05-26 DIAGNOSIS — F322 Major depressive disorder, single episode, severe without psychotic features: Secondary | ICD-10-CM | POA: Diagnosis not present

## 2020-05-26 DIAGNOSIS — J449 Chronic obstructive pulmonary disease, unspecified: Secondary | ICD-10-CM | POA: Diagnosis not present

## 2020-05-26 DIAGNOSIS — I10 Essential (primary) hypertension: Secondary | ICD-10-CM | POA: Diagnosis not present

## 2020-05-26 DIAGNOSIS — E1169 Type 2 diabetes mellitus with other specified complication: Secondary | ICD-10-CM | POA: Diagnosis not present

## 2020-05-26 DIAGNOSIS — Z794 Long term (current) use of insulin: Secondary | ICD-10-CM | POA: Diagnosis not present

## 2020-05-26 DIAGNOSIS — I130 Hypertensive heart and chronic kidney disease with heart failure and stage 1 through stage 4 chronic kidney disease, or unspecified chronic kidney disease: Secondary | ICD-10-CM | POA: Diagnosis not present

## 2020-05-26 DIAGNOSIS — E7800 Pure hypercholesterolemia, unspecified: Secondary | ICD-10-CM | POA: Diagnosis not present

## 2020-05-26 DIAGNOSIS — M545 Low back pain, unspecified: Secondary | ICD-10-CM | POA: Diagnosis not present

## 2020-05-26 DIAGNOSIS — J4520 Mild intermittent asthma, uncomplicated: Secondary | ICD-10-CM | POA: Diagnosis not present

## 2020-05-26 DIAGNOSIS — F419 Anxiety disorder, unspecified: Secondary | ICD-10-CM | POA: Diagnosis not present

## 2020-05-26 DIAGNOSIS — E1122 Type 2 diabetes mellitus with diabetic chronic kidney disease: Secondary | ICD-10-CM | POA: Diagnosis not present

## 2020-05-26 DIAGNOSIS — K7460 Unspecified cirrhosis of liver: Secondary | ICD-10-CM | POA: Diagnosis not present

## 2020-05-27 ENCOUNTER — Other Ambulatory Visit: Payer: Medicare HMO

## 2020-06-12 ENCOUNTER — Other Ambulatory Visit: Payer: Medicare HMO

## 2020-07-03 ENCOUNTER — Other Ambulatory Visit: Payer: Medicare HMO

## 2020-07-22 ENCOUNTER — Other Ambulatory Visit: Payer: Medicare HMO

## 2020-08-06 ENCOUNTER — Other Ambulatory Visit: Payer: Medicare HMO

## 2020-08-25 DIAGNOSIS — M545 Low back pain, unspecified: Secondary | ICD-10-CM | POA: Diagnosis not present

## 2020-09-11 ENCOUNTER — Other Ambulatory Visit: Payer: Medicare HMO

## 2020-09-18 ENCOUNTER — Other Ambulatory Visit: Payer: Medicare HMO

## 2020-11-20 DIAGNOSIS — F419 Anxiety disorder, unspecified: Secondary | ICD-10-CM | POA: Diagnosis not present

## 2020-11-20 DIAGNOSIS — F322 Major depressive disorder, single episode, severe without psychotic features: Secondary | ICD-10-CM | POA: Diagnosis not present

## 2020-11-20 DIAGNOSIS — E1169 Type 2 diabetes mellitus with other specified complication: Secondary | ICD-10-CM | POA: Diagnosis not present

## 2020-11-20 DIAGNOSIS — E7800 Pure hypercholesterolemia, unspecified: Secondary | ICD-10-CM | POA: Diagnosis not present

## 2020-11-20 DIAGNOSIS — N1832 Chronic kidney disease, stage 3b: Secondary | ICD-10-CM | POA: Diagnosis not present

## 2020-11-20 DIAGNOSIS — K7460 Unspecified cirrhosis of liver: Secondary | ICD-10-CM | POA: Diagnosis not present

## 2020-11-20 DIAGNOSIS — J4520 Mild intermittent asthma, uncomplicated: Secondary | ICD-10-CM | POA: Diagnosis not present

## 2020-11-20 DIAGNOSIS — I10 Essential (primary) hypertension: Secondary | ICD-10-CM | POA: Diagnosis not present

## 2020-12-04 ENCOUNTER — Other Ambulatory Visit: Payer: Medicare HMO

## 2020-12-14 DIAGNOSIS — Z720 Tobacco use: Secondary | ICD-10-CM | POA: Diagnosis not present

## 2020-12-14 DIAGNOSIS — Z794 Long term (current) use of insulin: Secondary | ICD-10-CM | POA: Diagnosis not present

## 2020-12-14 DIAGNOSIS — E669 Obesity, unspecified: Secondary | ICD-10-CM | POA: Diagnosis not present

## 2020-12-14 DIAGNOSIS — E1165 Type 2 diabetes mellitus with hyperglycemia: Secondary | ICD-10-CM | POA: Diagnosis not present

## 2020-12-14 DIAGNOSIS — Z23 Encounter for immunization: Secondary | ICD-10-CM | POA: Diagnosis not present

## 2020-12-14 DIAGNOSIS — E1142 Type 2 diabetes mellitus with diabetic polyneuropathy: Secondary | ICD-10-CM | POA: Diagnosis not present

## 2020-12-16 ENCOUNTER — Other Ambulatory Visit: Payer: Medicare HMO

## 2020-12-30 ENCOUNTER — Ambulatory Visit
Admission: RE | Admit: 2020-12-30 | Discharge: 2020-12-30 | Disposition: A | Discharge status: Home / Self Care | Payer: Medicare HMO | Source: Ambulatory Visit | Attending: Gastroenterology | Admitting: Gastroenterology

## 2020-12-30 DIAGNOSIS — K7460 Unspecified cirrhosis of liver: Secondary | ICD-10-CM

## 2021-02-05 DIAGNOSIS — Z0000 Encounter for general adult medical examination without abnormal findings: Secondary | ICD-10-CM | POA: Diagnosis not present

## 2021-02-05 DIAGNOSIS — I10 Essential (primary) hypertension: Secondary | ICD-10-CM | POA: Diagnosis not present

## 2021-02-05 DIAGNOSIS — M545 Low back pain, unspecified: Secondary | ICD-10-CM | POA: Diagnosis not present

## 2021-03-06 DIAGNOSIS — R404 Transient alteration of awareness: Secondary | ICD-10-CM | POA: Diagnosis not present

## 2021-03-17 DIAGNOSIS — 419620001 Death: Secondary | SNOMED CT | POA: Diagnosis not present

## 2021-03-17 DEATH — deceased
